# Patient Record
Sex: Female | Born: 1960 | Hispanic: No | Marital: Single | Smoking: Current every day smoker
Health system: Southern US, Community
[De-identification: ages and names within clinical notes are randomized; demographics above are authoritative.]

## PROBLEM LIST (undated history)

## (undated) DIAGNOSIS — I10 Essential (primary) hypertension: Secondary | ICD-10-CM

## (undated) DIAGNOSIS — E785 Hyperlipidemia, unspecified: Secondary | ICD-10-CM

## (undated) DIAGNOSIS — E119 Type 2 diabetes mellitus without complications: Secondary | ICD-10-CM

## (undated) DIAGNOSIS — I639 Cerebral infarction, unspecified: Secondary | ICD-10-CM

## (undated) HISTORY — DX: Hyperlipidemia, unspecified: E78.5

## (undated) HISTORY — DX: Type 2 diabetes mellitus without complications: E11.9

## (undated) HISTORY — DX: Cerebral infarction, unspecified: I63.9

---

## 2008-03-13 ENCOUNTER — Other Ambulatory Visit: Admission: RE | Admit: 2008-03-13 | Discharge: 2008-03-13 | Payer: Self-pay | Admitting: Obstetrics and Gynecology

## 2008-08-24 ENCOUNTER — Emergency Department (HOSPITAL_COMMUNITY): Admission: EM | Admit: 2008-08-24 | Discharge: 2008-08-24 | Payer: Self-pay | Admitting: Emergency Medicine

## 2010-07-13 ENCOUNTER — Encounter: Payer: Self-pay | Admitting: Obstetrics and Gynecology

## 2010-10-02 LAB — DIFFERENTIAL
Basophils Relative: 1 % (ref 0–1)
Lymphocytes Relative: 36 % (ref 12–46)
Lymphs Abs: 3 10*3/uL (ref 0.7–4.0)
Neutro Abs: 4.4 10*3/uL (ref 1.7–7.7)

## 2010-10-02 LAB — D-DIMER, QUANTITATIVE: D-Dimer, Quant: 0.28 ug/mL-FEU (ref 0.00–0.48)

## 2010-10-02 LAB — CBC
Hemoglobin: 10.7 g/dL — ABNORMAL LOW (ref 12.0–15.0)
Platelets: 200 10*3/uL (ref 150–400)
RBC: 4.29 MIL/uL (ref 3.87–5.11)

## 2010-10-02 LAB — POCT I-STAT, CHEM 8
BUN: 4 mg/dL — ABNORMAL LOW (ref 6–23)
Creatinine, Ser: 0.9 mg/dL (ref 0.4–1.2)
Glucose, Bld: 100 mg/dL — ABNORMAL HIGH (ref 70–99)
Hemoglobin: 11.9 g/dL — ABNORMAL LOW (ref 12.0–15.0)
Sodium: 140 mEq/L (ref 135–145)

## 2010-10-02 LAB — POCT CARDIAC MARKERS
CKMB, poc: <1 ng/mL — ABNORMAL LOW (ref 1.0–8.0)
Myoglobin, poc: 91.8 ng/mL (ref 12–200)
Troponin i, poc: <0.05 ng/mL (ref 0.00–0.09)

## 2011-04-28 ENCOUNTER — Encounter: Payer: Self-pay | Admitting: *Deleted

## 2011-04-28 ENCOUNTER — Emergency Department (INDEPENDENT_AMBULATORY_CARE_PROVIDER_SITE_OTHER)
Admission: EM | Admit: 2011-04-28 | Discharge: 2011-04-28 | Disposition: A | Discharge status: Home / Self Care | Payer: 59 | Source: Home / Self Care | Attending: Family Medicine | Admitting: Family Medicine

## 2011-04-28 DIAGNOSIS — J40 Bronchitis, not specified as acute or chronic: Secondary | ICD-10-CM

## 2011-04-28 MED ORDER — AZITHROMYCIN 250 MG PO TABS: 250.0000 mg | ORAL_TABLET | Freq: Every day | ORAL | Status: AC

## 2011-04-28 MED ORDER — GUAIFENESIN-CODEINE 100-10 MG/5ML PO SYRP: 5.0000 mL | ORAL_SOLUTION | Freq: Four times a day (QID) | ORAL | Status: AC | PRN

## 2011-04-28 NOTE — ED Provider Notes (Signed)
History     CSN: [id number] Arrival date & time: 04/28/2011  5:01 PM   First MD Initiated Contact with Patient 04/28/11 1741      Chief Complaint  Patient presents with  . Cough    4th day of a  nonproductive cough with fever  3 days ago.  Today c/o weakness and generalized myalgias    (Consider location/radiation/quality/duration/timing/severity/associated sxs/prior treatment) Patient is a 50 y.o. female presenting with cough.  Cough This is a new problem. The current episode started more than 2 days ago. The problem occurs constantly. The problem has not changed since onset.The cough is non-productive. The maximum temperature recorded prior to her arrival was 101 to 101.9 F. The fever has been present for 1 to 2 days. Associated symptoms include chills, sweats and rhinorrhea. Pertinent negatives include no chest pain and no wheezing. She has tried decongestants for the symptoms. The treatment provided mild relief. She is a smoker.    History reviewed. No pertinent past medical history.  History reviewed. No pertinent past surgical history.  Family History  Problem Relation Age of Onset  . Hypertension Mother   . Cancer Sister     History  Substance Use Topics  . Smoking status: Not on file  . Smokeless tobacco: Not on file  . Alcohol Use: No    OB History    Grav Para Term Preterm Abortions TAB SAB Ect Mult Living                  Review of Systems  Constitutional: Positive for fever and chills.  HENT: Positive for rhinorrhea and postnasal drip.   Respiratory: Positive for cough. Negative for wheezing.   Cardiovascular: Negative for chest pain.  Gastrointestinal: Negative.   Genitourinary: Negative.     Allergies  Review of patient's allergies indicates no known allergies.  Home Medications  No current outpatient prescriptions on file.  BP 163/93  Pulse 100  Temp(Src) 98.4 F (36.9 C) (Oral)  SpO2 99%  Physical Exam  Nursing note and vitals  reviewed. Constitutional: She appears well-developed and well-nourished.  HENT:  Head: Normocephalic and atraumatic.  Right Ear: External ear normal.  Left Ear: External ear normal.  Nose: Nose normal.  Mouth/Throat: Oropharynx is clear and moist.  Neck: Normal range of motion. Neck supple.  Cardiovascular: Normal rate and regular rhythm.   Pulmonary/Chest: Effort normal and breath sounds normal. No respiratory distress. She has no wheezes. She has no rales. She exhibits no tenderness.  Lymphadenopathy:    She has no cervical adenopathy.  Skin: Skin is warm and dry.  Psychiatric: She has a normal mood and affect.    ED Course  Procedures (including critical care time)  Labs Reviewed - No data to display No results found.   No diagnosis found.    MDM          Randa Spike, MD 04/28/11 [phone]

## 2011-07-08 ENCOUNTER — Emergency Department (HOSPITAL_COMMUNITY): Payer: 59

## 2011-07-08 ENCOUNTER — Observation Stay (HOSPITAL_COMMUNITY)
Admission: EM | Admit: 2011-07-08 | Discharge: 2011-07-09 | Disposition: A | Discharge status: Home / Self Care | Payer: 59 | Attending: Family Medicine | Admitting: Family Medicine

## 2011-07-08 ENCOUNTER — Other Ambulatory Visit: Payer: Self-pay

## 2011-07-08 ENCOUNTER — Encounter (HOSPITAL_COMMUNITY): Payer: Self-pay | Admitting: Emergency Medicine

## 2011-07-08 DIAGNOSIS — F17200 Nicotine dependence, unspecified, uncomplicated: Secondary | ICD-10-CM | POA: Insufficient documentation

## 2011-07-08 DIAGNOSIS — R404 Transient alteration of awareness: Secondary | ICD-10-CM

## 2011-07-08 DIAGNOSIS — E119 Type 2 diabetes mellitus without complications: Secondary | ICD-10-CM | POA: Insufficient documentation

## 2011-07-08 DIAGNOSIS — I10 Essential (primary) hypertension: Secondary | ICD-10-CM | POA: Diagnosis present

## 2011-07-08 DIAGNOSIS — E860 Dehydration: Secondary | ICD-10-CM | POA: Insufficient documentation

## 2011-07-08 DIAGNOSIS — R000 Tachycardia, unspecified: Secondary | ICD-10-CM | POA: Insufficient documentation

## 2011-07-08 DIAGNOSIS — E785 Hyperlipidemia, unspecified: Secondary | ICD-10-CM

## 2011-07-08 DIAGNOSIS — R739 Hyperglycemia, unspecified: Secondary | ICD-10-CM

## 2011-07-08 DIAGNOSIS — R55 Syncope and collapse: Principal | ICD-10-CM

## 2011-07-08 HISTORY — DX: Essential (primary) hypertension: I10

## 2011-07-08 LAB — DIFFERENTIAL
Basophils Absolute: 0 10*3/uL (ref 0.0–0.1)
Basophils Relative: 0 % (ref 0–1)
Eosinophils Absolute: 0 10*3/uL (ref 0.0–0.7)
Eosinophils Relative: 0 % (ref 0–5)
Lymphocytes Relative: 12 % (ref 12–46)
Lymphs Abs: 2.5 10*3/uL (ref 0.7–4.0)
Monocytes Absolute: 1.3 10*3/uL — ABNORMAL HIGH (ref 0.1–1.0)
Monocytes Relative: 6 % (ref 3–12)
Neutro Abs: 17.2 10*3/uL — ABNORMAL HIGH (ref 1.7–7.7)
Neutrophils Relative %: 82 % — ABNORMAL HIGH (ref 43–77)

## 2011-07-08 LAB — URINALYSIS, ROUTINE W REFLEX MICROSCOPIC
Bilirubin Urine: NEGATIVE
Glucose, UA: >1000 mg/dL — AB
Hgb urine dipstick: NEGATIVE
Ketones, ur: 15 mg/dL — AB
Nitrite: NEGATIVE
Protein, ur: NEGATIVE mg/dL
Specific Gravity, Urine: 1.017 (ref 1.005–1.030)
Urobilinogen, UA: 1 mg/dL (ref 0.0–1.0)
pH: 6.5 (ref 5.0–8.0)

## 2011-07-08 LAB — CBC
HCT: 35.8 % — ABNORMAL LOW (ref 36.0–46.0)
Hemoglobin: 11.1 g/dL — ABNORMAL LOW (ref 12.0–15.0)
MCH: 20.9 pg — ABNORMAL LOW (ref 26.0–34.0)
MCHC: 31 g/dL (ref 30.0–36.0)
MCV: 67.4 fL — ABNORMAL LOW (ref 78.0–100.0)
Platelets: 295 10*3/uL (ref 150–400)
RBC: 5.31 MIL/uL — ABNORMAL HIGH (ref 3.87–5.11)
RDW: 18.6 % — ABNORMAL HIGH (ref 11.5–15.5)
WBC: 21 10*3/uL — ABNORMAL HIGH (ref 4.0–10.5)

## 2011-07-08 LAB — COMPREHENSIVE METABOLIC PANEL
ALT: 18 U/L (ref 0–35)
AST: 19 U/L (ref 0–37)
Albumin: 3.5 g/dL (ref 3.5–5.2)
Alkaline Phosphatase: 116 U/L (ref 39–117)
BUN: 8 mg/dL (ref 6–23)
CO2: 24 mEq/L (ref 19–32)
Calcium: 9 mg/dL (ref 8.4–10.5)
Chloride: 100 mEq/L (ref 96–112)
Creatinine, Ser: 0.8 mg/dL (ref 0.50–1.10)
GFR calc Af Amer: >90 mL/min (ref >90)
GFR calc non Af Amer: 84 mL/min — ABNORMAL LOW (ref >90)
Glucose, Bld: 350 mg/dL — ABNORMAL HIGH (ref 70–99)
Potassium: 3.5 mEq/L (ref 3.5–5.1)
Sodium: 136 mEq/L (ref 135–145)
Total Bilirubin: 0.3 mg/dL (ref 0.3–1.2)
Total Protein: 8 g/dL (ref 6.0–8.3)

## 2011-07-08 LAB — GLUCOSE, CAPILLARY
Glucose-Capillary: 342 mg/dL — ABNORMAL HIGH (ref 70–99)
Glucose-Capillary: 316 mg/dL — ABNORMAL HIGH (ref 70–99)

## 2011-07-08 LAB — CARDIAC PANEL(CRET KIN+CKTOT+MB+TROPI)
CK, MB: 1.6 ng/mL (ref 0.3–4.0)
Relative Index: 0.9 (ref 0.0–2.5)
Total CK: 175 U/L (ref 7–177)
Troponin I: <0.3 ng/mL (ref <0.30)

## 2011-07-08 LAB — URINE MICROSCOPIC-ADD ON

## 2011-07-08 MED ORDER — ONDANSETRON HCL 4 MG/2ML IJ SOLN
INTRAMUSCULAR | Status: AC
  Administered 2011-07-08: 14:00:00
  Filled 2011-07-08: qty 2

## 2011-07-08 MED ORDER — ACETAMINOPHEN 325 MG PO TABS: 650.0000 mg | ORAL_TABLET | Freq: Four times a day (QID) | ORAL | Status: DC | PRN

## 2011-07-08 MED ORDER — SODIUM CHLORIDE 0.9 % IV SOLN
INTRAVENOUS | Status: DC
  Administered 2011-07-08: 20:00:00 via INTRAVENOUS

## 2011-07-08 MED ORDER — ACETAMINOPHEN 650 MG RE SUPP: 650.0000 mg | Freq: Four times a day (QID) | RECTAL | Status: DC | PRN

## 2011-07-08 MED ORDER — HEPARIN SODIUM (PORCINE) 5000 UNIT/ML IJ SOLN
5000.0000 [IU] | Freq: Three times a day (TID) | INTRAMUSCULAR | Status: DC
  Administered 2011-07-08 – 2011-07-09 (×2): 5000 [IU] via SUBCUTANEOUS
  Filled 2011-07-08 (×5): qty 1

## 2011-07-08 MED ORDER — ONDANSETRON HCL 4 MG PO TABS: 4.0000 mg | ORAL_TABLET | Freq: Four times a day (QID) | ORAL | Status: DC | PRN

## 2011-07-08 MED ORDER — SODIUM CHLORIDE 0.9 % IV BOLUS (SEPSIS)
1000.0000 mL | Freq: Once | INTRAVENOUS | Status: AC
  Administered 2011-07-08: 1000 mL via INTRAVENOUS

## 2011-07-08 MED ORDER — ONDANSETRON HCL 4 MG/2ML IJ SOLN: 4.0000 mg | Freq: Four times a day (QID) | INTRAMUSCULAR | Status: DC | PRN

## 2011-07-08 MED ORDER — INSULIN ASPART 100 UNIT/ML ~~LOC~~ SOLN
0.0000 [IU] | Freq: Three times a day (TID) | SUBCUTANEOUS | Status: DC
  Administered 2011-07-09 (×2): 3 [IU] via SUBCUTANEOUS
  Filled 2011-07-08: qty 3

## 2011-07-08 MED ORDER — DOCUSATE SODIUM 100 MG PO CAPS
100.0000 mg | ORAL_CAPSULE | Freq: Two times a day (BID) | ORAL | Status: DC
  Administered 2011-07-08 – 2011-07-09 (×2): 100 mg via ORAL
  Filled 2011-07-08 (×3): qty 1

## 2011-07-08 NOTE — ED Notes (Signed)
CBG 285. 

## 2011-07-08 NOTE — ED Notes (Signed)
Was at work  Started to tense up and ? Vomit  Was incontinent  Has no memory pulse 115 128/70 cbg 393 iv 20 left wrist also c/o abd pain given 4 zofran

## 2011-07-08 NOTE — H&P (Signed)
FMTS Attending Admit Note  Patient seen and examined by me, discussed with resident team and I agree with Dr. Jamie Kato assessment and plan.  Briefly, a 51yo R-handed F with scant interactions with medical community, presenting via EMS for syncopal episode at her workplace.  Co-workers came to her aid after she had fallen to ground; was told by coworker that she had a "stiff left arm and was able to move my right arm".  Some loss of urine, which she ascribes to recent increase in polyuria to the point of losing continence frequently.  No palpitations; did have premonition that she would pass out; felt foggy for some time afterward, was aware that EMS was transporting her but could not interact/speak with them. At present she feels back to baseline.  Reports similar epsiode in her home 6 months ago, unwitnessed.  Felt like she'd pass out, also nausea, went to BR to vomit and awoke in the floor.  Did not seek medical attention for that episode.  ROS: No head trauma, no sz history.  Did have a period where she had "shaking" in arms when she lived in [place] in mid-2000s (?2004 or 2006) and was seen by neurologist.  The symptoms eventually resolved.   Exam Pleasant, no apparent distress. HEENT Neck supple. EOMI.  Smile symmetric. Speech fluid and clear.  Shoulder shrug normal and symmetric.  COR S1S2, no extra sounds PULM Clear  Handgrip full and symmetric.  Assess/Plan: 51 yo F with LOC, also found to be markedly hyperglycemic with polyuria and polydipsia.  Prior episode like this 6 months ago.  Seems most consistent with uncontrolled DM2 and subsequent dehydration that resulted in vasovagal episode.  Seizure d/o is less likely.  To consider whether EEG is warranted as part of workup in order to remove the small doubt that exists as to this possibility. Will continue to watch on telemetry overnight, follow Cardiac Enzymes over night.   Paula Compton, M.D.

## 2011-07-08 NOTE — H&P (Signed)
Family Medicine Teaching [facility] Admission History and Physical  Patient name: Alexandra Wilson Medical record number: [id number] Date of birth: 07/20/1960 Age: 51 y.o. Gender: female  Primary Care Provider: Unassigned/none  Chief Complaint: syncope History of Present Illness: Alexandra Wilson is a 51 y.o. year old female that does not have established medical care but only known medical problem of HTN diagnosed at a health fair presenting with syncope.   Patient was at work today sitting down. She reports eating nothing today and only drinking 1 cup of coffee. Additionally, she says she has been peeing more frequently over the last few days. She ate an orange and she says she felt a rush of heat to her head. She then began to get very lightleaded and dizzy and sweaty. She laid her head down on her desk and says the next thing she knew EMS was at her work place. She does not recall any chest pain/shortness of breath/nausea/vomiting. Some diaphoresis.   She spoke with a coworker who says she was moaning and that her legs were slightly stiff and so was her left arm. She said that  so she sat down in a char. She says she does not remember events after that. Coworkers report that she may have had some shaking when this occurred. She says when she arrived in ED, her pants were somewhat moist and she was concerned that she may have urinated on herself but she is not sure. No bowel incontinence.   ROS-Asked patient about blurry vision and she says sometimes at the computer all day the screen will seem a little fuzzy but no issues outside of that. Patient also has some dysuria but reports she vigorously cleans her vagina due to a slight vaginal itch (no prominent discharge) and she believes the pain is coming from there. Patient says for last month or two she has had some increased thirst (wants water over just pepsi).   Patient reported to ED where she had a CT head without intracranial abnormality.  EKG with sinus tachycardia and no st-t wave abnormalities. CE neg x1. Patient was found to have a CBG of 350 and a WBC of 21k with a left shift.    Past Medical History  Diagnosis Date  . Hypertension   Has not seen a physician outside of urgent care in 8 years.   History reviewed. No pertinent past surgical history.  Family History  Problem Relation Age of Onset  . Hypertension Mother   . Cancer Sister   . Diabetes Cousin     also maternal aunts   Social History:  reports that she has been smoking Cigarettes.  She has a 20 pack-year smoking history. She does not have any smokeless tobacco history on file. She reports that she does not drink alcohol or use illicit drugs. Please see social history as updated with social situation.   Allergies: No Known Allergies  Medications Prior to Admission  Medication Dose Route Frequency Provider Last Rate Last Dose  . ondansetron (ZOFRAN) 4 MG/2ML injection           . sodium chloride 0.9 % bolus 1,000 mL  1,000 mL Intravenous Once Jamesetta Orleans Lawyer, PA-C   1,000 mL at 07/08/11 1335   No current outpatient prescriptions on file as of 07/08/2011.    Results for orders placed during the hospital encounter of 07/08/11 (from the past 48 hour(s))  GLUCOSE, CAPILLARY     Status: Abnormal   Collection Time   07/08/11 12:13  PM      Component Value Range Comment   Glucose-Capillary 342 (*) 70 - 99 (mg/dL)   COMPREHENSIVE METABOLIC PANEL     Status: Abnormal   Collection Time   07/08/11  1:06 PM      Component Value Range Comment   Sodium 136  135 - 145 (mEq/L)    Potassium 3.5  3.5 - 5.1 (mEq/L)    Chloride 100  96 - 112 (mEq/L)    CO2 24  19 - 32 (mEq/L)    Glucose, Bld 350 (*) 70 - 99 (mg/dL)    BUN 8  6 - 23 (mg/dL)    Creatinine, Ser 8.65  0.50 - 1.10 (mg/dL)    Calcium 9.0  8.4 - 10.5 (mg/dL)    Total Protein 8.0  6.0 - 8.3 (g/dL)    Albumin 3.5  3.5 - 5.2 (g/dL)    AST 19  0 - 37 (U/L)    ALT 18  0 - 35 (U/L)    Alkaline  Phosphatase 116  39 - 117 (U/L)    Total Bilirubin 0.3  0.3 - 1.2 (mg/dL)    GFR calc non Af Amer 84 (*) >90 (mL/min)    GFR calc Af Amer >90  >90 (mL/min)   CBC     Status: Abnormal   Collection Time   07/08/11  1:06 PM      Component Value Range Comment   WBC 21.0 (*) 4.0 - 10.5 (K/uL)    RBC 5.31 (*) 3.87 - 5.11 (MIL/uL)    Hemoglobin 11.1 (*) 12.0 - 15.0 (g/dL)    HCT 78.4 (*) 69.6 - 46.0 (%)    MCV 67.4 (*) 78.0 - 100.0 (fL)    MCH 20.9 (*) 26.0 - 34.0 (pg)    MCHC 31.0  30.0 - 36.0 (g/dL)    RDW 29.5 (*) 28.4 - 15.5 (%)    Platelets 295  150 - 400 (K/uL)   DIFFERENTIAL     Status: Abnormal   Collection Time   07/08/11  1:06 PM      Component Value Range Comment   Neutrophils Relative 82 (*) 43 - 77 (%)    Lymphocytes Relative 12  12 - 46 (%)    Monocytes Relative 6  3 - 12 (%)    Eosinophils Relative 0  0 - 5 (%)    Basophils Relative 0  0 - 1 (%)    Neutro Abs 17.2 (*) 1.7 - 7.7 (K/uL)    Lymphs Abs 2.5  0.7 - 4.0 (K/uL)    Monocytes Absolute 1.3 (*) 0.1 - 1.0 (K/uL)    Eosinophils Absolute 0.0  0.0 - 0.7 (K/uL)    Basophils Absolute 0.0  0.0 - 0.1 (K/uL)    Smear Review LARGE PLATELETS PRESENT     URINALYSIS, ROUTINE W REFLEX MICROSCOPIC     Status: Abnormal   Collection Time   07/08/11  1:22 PM      Component Value Range Comment   Color, Urine YELLOW  YELLOW     APPearance CLOUDY (*) CLEAR     Specific Gravity, Urine 1.017  1.005 - 1.030     pH 6.5  5.0 - 8.0     Glucose, UA >1000 (*) NEGATIVE (mg/dL)    Hgb urine dipstick NEGATIVE  NEGATIVE     Bilirubin Urine NEGATIVE  NEGATIVE     Ketones, ur 15 (*) NEGATIVE (mg/dL)    Protein, ur NEGATIVE  NEGATIVE (mg/dL)  Urobilinogen, UA 1.0  0.0 - 1.0 (mg/dL)    Nitrite NEGATIVE  NEGATIVE     Leukocytes, UA MODERATE (*) NEGATIVE    URINE MICROSCOPIC-ADD ON     Status: Abnormal   Collection Time   07/08/11  1:22 PM      Component Value Range Comment   Squamous Epithelial / LPF FEW (*) RARE     WBC, UA 3-6  <3  (WBC/hpf)    Bacteria, UA FEW (*) RARE    GLUCOSE, CAPILLARY     Status: Abnormal   Collection Time   07/08/11  3:02 PM      Component Value Range Comment   Glucose-Capillary 316 (*) 70 - 99 (mg/dL)    Comment 1 Documented in Chart      Comment 2 Notify RN     CARDIAC PANEL(CRET KIN+CKTOT+MB+TROPI)     Status: Normal   Collection Time   07/08/11  3:28 PM      Component Value Range Comment   Total CK 175  7 - 177 (U/L)    CK, MB 1.6  0.3 - 4.0 (ng/mL)    Troponin I <0.30  <0.30 (ng/mL)    Relative Index 0.9  0.0 - 2.5     Dg Chest 2 View  07/08/2011  *RADIOLOGY REPORT*  Clinical Data: Syncope, smoker  CHEST - 2 VIEW  Comparison: 08/24/2008  Findings: Upper-normal size of cardiac silhouette. Mediastinal contours and pulmonary vascularity normal. Peribronchial thickening. No pulmonary infiltrate, pleural effusion, or pneumothorax. No acute osseous findings.  IMPRESSION: Minimal bronchitic changes.  Original Report Authenticated By: Lollie Marrow, M.D.   Ct Head Wo Contrast  07/08/2011  *RADIOLOGY REPORT*  Clinical Data: Seizure.  CT HEAD WITHOUT CONTRAST  Technique:  Contiguous axial images were obtained from the base of the skull through the vertex without contrast.  Comparison: None.  Findings: The brain has a normal appearance without evidence of atrophy, old or acute infarction, mass lesion, hemorrhage, hydrocephalus or extra-axial collection.  Calvarium is unremarkable.  Sinuses, middle ears and mastoids are clear.  IMPRESSION: Normal head CT  Original Report Authenticated By: Thomasenia Sales, M.D.    ROS negative except as noted in HPI   Blood pressure 105/67, pulse 103, temperature 97 F (36.1 C), temperature source Oral, resp. rate 18, SpO2 100.00%. Physical Exam  Constitutional: She is oriented to person, place, and time. She appears well-developed and well-nourished. No distress.  HENT:  Mouth/Throat: No oropharyngeal exudate.       Dry mucus membranes.   Eyes: Pupils are equal,  round, and reactive to light.  Neck: Normal range of motion. Neck supple.  Cardiovascular: Normal rate and regular rhythm.  Exam reveals no gallop and no friction rub.   No murmur heard. Respiratory: Effort normal and breath sounds normal. No respiratory distress. She has no wheezes. She has no rales.  GI: Soft. Bowel sounds are normal. She exhibits no distension. There is no tenderness. There is no rebound.  Musculoskeletal: Normal range of motion. Edema: trace bilateral ankle edema.  Neurological: She is alert and oriented to person, place, and time. She displays normal reflexes. She exhibits normal muscle tone. Coordination normal.       5/5 muscle strength bilateral upper extremities. No focal deficits. Sensation intact.      Assessment/Plan  51 y.o. year old female that does not have established medical care but only known medical problem of HTN presenting with syncope.   1. Syncope-CT head negative for intracranial abnormality.  EKG with sinus tachycardia and no st-t wave abnormalities. DDx: dehydration causing vasovagal most likely cause as patient with poor PO throughout the morning and exam with appearance of dehydration. Orthostatics showed increased HR by 15 as well. Do not suspect seizure but in differential.CTA negative and primary seizures in 51 year old females are uncommon.  Unsure if patient was incontinent of urine or just overall sweaty. Will rule out MI. Monitor for arrythmias. UDS to rule out drug use.  *will monitor on telemetry for 23 hour observation  *will hydrate with IVF *cycle cardiac enzymes x2  *Risk stratification : lipids, a1c, tsh *see #3 for possible ID concerns.  *consider EEG outpatient *UDS  2. Possible new onset diabetes-will obtain A1c. Will not order ABG at this time given BS trending down, but if were to increase would consider ABG for evaluation of DKA. SSI as patient insulin naive. Suspect DM. Ordered inpatient diabetes counselor to see patient  3.  Tobacco abuse-encouraged smoking cessation and ordered smoking cessation counseling.   3. Leukocytosis- UA with moderate leukocytes. Will get Urine Cx. Given syncope and tachycardia and WBC count. Would have low threshold to get blood cultures. CXR with minimal bronchitic changes.   4. Hx HTN-currently with blood pressures on low range of normal. WIll monitor blood pressures.   FEN/GI-carb modified diet. NS 100 ml/hr PPx-heparin SQ Disposition-pending further work up/evaluation   [place], Idell Hissong 07/08/2011, 4:17 PM    PGY-2 ADDENDUM I have seen and examined this patient and agree with Dr. Erasmo Leventhal above note and assessment and plan.  For full details please see excellent PGY-1 note.  Briefly, this is a 51 yo AAF who hasn't seen an MD since 2008 presenting after syncopal episode while at work this morning.  She states she skipped breakfast and while at her desk at work she hot a "hot" feeling, put her head down on the table, and then doesn't remember anything until EMS arrived.  Patient had her co-worker on the phone who described what happened as her lying on the ground moaning, bilateral lower extremities out and stiff with maybe some left-sided contracture of her left arm.  She had an episode of urinary incontinence/ pants getting "a little wet".  They are unsure of what interventions were done by EMS.  Patient does not have any known history of seizure or seizure disorder.  She has possible HTN from an isolated elevated BP during a health screen.  No history of diabetes, but does have family history on her maternal side.  Has had some increased thirst the past 2-3 weeks and increased urination the past 2-3 days.  She has had some dysuria but attributes this to vigorous vaginal cleansing due to vaginal itching, and dysuria does not occur if she does not scratch/clean the area.  No fevers or chills, no N/V/D, no chest pain, palpitations or shortness of breath.  Possibly had a similar episode of  pre-syncope 6 months ago that resolved spontaneously with drinking 2-3 bottles of water.  She is a 1ppd smoker, but denies tobacco or illicit drugs.   PE: Filed Vitals:   07/08/11 1630  BP: 112/77  Pulse: 108  Temp:   Resp:     Gen: NAD, lying in stretcher, pleasant, overweight HEENT: dry mucus membranes, PERRLA, EOMI, no cervical LAD, fair dentition, no pharyngeal erythema CV: RRR, no murmur Pulm: CTA bilaterally, no wheezes or crackles Abd: obese, soft, nontender, nondistended, +BS Ext: warm, 2+ pulses, no edema Neuro: A&O x3, moves all 4  extremities equally, no focal deficits  Labs:  BMET unremarkable except glucose 342, UA with >1000 glucose, 15 ketonesmod LE, few bacteria and epi CBC with leukocytosis to 22 with left shift, hgb 11 CT head: negative CXR: Minimal bronchitic changes EKG: sinus tachycardia  A/P: 51 yo AAF p/w syncopal episode likely secondary to dehydration from undiagnosed type 2 DM  1.Syncope: Ddx includes hypoglycemia vs hypotension vs vasovagal vs seizure vs cardiac arrhythmia - Seziure less likely given unilateral upper ext but bilateral lower ext involvement with no known seizure d/o and normal head CT - Pt is currently hyperglycemic with >1000 glucose in urine, so even if patient was hypoglycemic at the scene and given D50 (which is unlikely), still would not expect these findings; she is likely an undiagnosed type II diabetic - Most likely cause is hypotension from dehydration; currently appears dry with mild tachycardia and increased HR with orthostatics - Continue IVF - Check UDS for other possible causes of syncope - Monitor on telemetry overnight and cycle cardiac enzymes in this overweight AAF who has possible h/o HTN and no medical care in 5 years; already has one negative set of CEs - Place on SSI, check A1c, FLP - Check fasting CBG in the morning for definitive diagnosis of DM, then will get diabetic teaching  *could consider outpatient   2.  ?HTN -currently normo/hypotensive -will monitor  3. Tobacco abuse - 1ppd history with bronchitis changes on CXR - Nicoderm patch if patient would like - Smoking cessation counseling   4. +LE, few bacteria on UA - May be UTI, but will culture urine prior to treatment   - FEN/GI: NS @ 100cc/hr, carb consistent diet po ad lib - Ppx: SQ heparin - Dispo: pending clinical improvement, completion of syncope work up, likely need for diabetic teaching  [facility] 07/08/2011 6:08 PM

## 2011-07-08 NOTE — ED Provider Notes (Signed)
Patient had an episode where she felt hot and lightheaded and nauseous and sweaty. She sat down and drank lobe of water but didn't feel better. She then passed out and coworkers witnessed what appears to been a seizure. She is now completely recovered. Workup was significant for a blood sugar of 350 which he was not aware of. On further questioning, she has had urinary frequency and she is noted that her vision has been blurry recently. Her WBC is 21,000 which is consistent with a stress reaction. Her exam shows absolutely no signs of nuchal rigidity and she is afebrile. Case has been discussed with family practice resident and she will be admitted for observation as well as initiation of treatment for diabetes. I suspect that she did have a seizure based on her remaining in the sitting position as she had her vagal episode.  Dione Booze, MD 07/08/11 [phone]

## 2011-07-08 NOTE — ED Provider Notes (Signed)
History     CSN: [id number]  Arrival date & time 07/08/11  1134   First MD Initiated Contact with Patient 07/08/11 1221      Chief Complaint  Patient presents with  . Seizures    (Consider location/radiation/quality/duration/timing/severity/associated sxs/prior treatment) The history is provided by the patient and a parent.   51 year old AA female presents to ED for possible seizure at work. Per EMS, her coworkers report that she began to stare, foam at the mouth, and began shaking. When EMS arrived, she was showing signs of postictal confusion and they reported that she vomited and was incontinent. The patient does not remember what happened. She reports that she remembers feeling dizzy and "funny all over" and the next thing she remembers is lying on the floor and hearing the EMS talk to her. Her mother states that they told her to bring a change of clothes because she had peed, but when her mom found her she was dry. Upon arrival, the staff reports that she was still somewhat confused, able to answer questions but slowly. She denies any pain. She reports some mild abdominal discomfort but thinks its because she needs to go to the bathroom. She reports numbness on the tip of her tongue but denies weakness, paresthesias, vision changes, and headache. No prior history or family history of epilepsy. EMS reported a CBG of 393. She denies diabetes or hyperglycemia in the past.   Past Medical History  Diagnosis Date  . Hypertension     History reviewed. No pertinent past surgical history.  Family History  Problem Relation Age of Onset  . Hypertension Mother   . Cancer Sister     History  Substance Use Topics  . Smoking status: Not on file  . Smokeless tobacco: Not on file  . Alcohol Use: No    OB History    Grav Para Term Preterm Abortions TAB SAB Ect Mult Living                  Review of Systems All pertinent positives and negatives reviewed in the history of present  illness  Allergies  Review of patient's allergies indicates no known allergies.  Home Medications  No current outpatient prescriptions on file.  BP 106/68  Pulse 89  Temp(Src) 98.2 F (36.8 C) (Oral)  Resp 18  SpO2 100%  Physical Exam  Constitutional: She is oriented to person, place, and time. She appears well-developed and well-nourished.       Overweight  HENT:  Head: Normocephalic and atraumatic.  Neck: No rigidity.  Cardiovascular: Normal rate, regular rhythm and normal heart sounds.        Distant heart sounds due to obesity  Pulmonary/Chest: Effort normal and breath sounds normal.  Abdominal: Soft. Bowel sounds are normal. There is tenderness in the left lower quadrant.       Mild tenderness in LLQ on palpation   Neurological: She is alert and oriented to person, place, and time.  Skin: Skin is warm and dry. She is not diaphoretic.  Psychiatric: She has a normal mood and affect. Her speech is normal and behavior is normal. Thought content normal.    ED Course  Procedures (including critical care time)   Labs Reviewed  POCT CBG MONITORING  URINALYSIS, ROUTINE W REFLEX MICROSCOPIC  COMPREHENSIVE METABOLIC PANEL  CBC  DIFFERENTIAL          MDM     Date: 07/08/2011  Rate: 103  Rhythm: sinus tachycardia  QRS Axis: normal  Intervals: normal  ST/T Wave abnormalities: nonspecific T wave changes  Conduction Disutrbances:none  Narrative Interpretation:   Old EKG Reviewed: unchanged    MDM Reviewed: nursing note and vitals Interpretation: labs, ECG, x-ray and CT scan Consults: admitting MD      Carlyle Dolly, PA-C 07/08/11 1606

## 2011-07-08 NOTE — ED Notes (Signed)
Pt feeling better at this time.

## 2011-07-08 NOTE — ED Provider Notes (Signed)
Medical screening examination/treatment/procedure(s) were conducted as a shared visit with non-physician practitioner(s) and myself.  I personally evaluated the patient during the encounter   Dione Booze, MD 07/08/11 1651

## 2011-07-08 NOTE — ED Notes (Signed)
sz pads placed on bed

## 2011-07-08 NOTE — ED Notes (Signed)
[id number] Ready 

## 2011-07-09 ENCOUNTER — Inpatient Hospital Stay (HOSPITAL_COMMUNITY)
Admit: 2011-07-09 | Discharge: 2011-07-09 | Disposition: A | Discharge status: Home / Self Care | Payer: 59 | Attending: Family Medicine | Admitting: Family Medicine

## 2011-07-09 DIAGNOSIS — E785 Hyperlipidemia, unspecified: Secondary | ICD-10-CM

## 2011-07-09 LAB — BASIC METABOLIC PANEL
BUN: 10 mg/dL (ref 6–23)
CO2: 24 mEq/L (ref 19–32)
Calcium: 8.8 mg/dL (ref 8.4–10.5)
Creatinine, Ser: 0.79 mg/dL (ref 0.50–1.10)
Glucose, Bld: 223 mg/dL — ABNORMAL HIGH (ref 70–99)

## 2011-07-09 LAB — URINE CULTURE: Culture  Setup Time: 201301170518

## 2011-07-09 LAB — GLUCOSE, CAPILLARY
Glucose-Capillary: 220 mg/dL — ABNORMAL HIGH (ref 70–99)
Glucose-Capillary: 241 mg/dL — ABNORMAL HIGH (ref 70–99)

## 2011-07-09 LAB — RAPID URINE DRUG SCREEN, HOSP PERFORMED
Cocaine: NOT DETECTED
Opiates: NOT DETECTED

## 2011-07-09 LAB — CBC
HCT: 31.4 % — ABNORMAL LOW (ref 36.0–46.0)
Hemoglobin: 9.6 g/dL — ABNORMAL LOW (ref 12.0–15.0)
MCH: 20.5 pg — ABNORMAL LOW (ref 26.0–34.0)
MCHC: 30.6 g/dL (ref 30.0–36.0)
MCV: 67.1 fL — ABNORMAL LOW (ref 78.0–100.0)
RBC: 4.68 MIL/uL (ref 3.87–5.11)

## 2011-07-09 LAB — LIPID PANEL
HDL: 39 mg/dL — ABNORMAL LOW (ref >39)
LDL Cholesterol: 109 mg/dL — ABNORMAL HIGH (ref 0–99)
Total CHOL/HDL Ratio: 4.2 RATIO
VLDL: 16 mg/dL (ref 0–40)

## 2011-07-09 LAB — CARDIAC PANEL(CRET KIN+CKTOT+MB+TROPI)
CK, MB: 2.1 ng/mL (ref 0.3–4.0)
Total CK: 208 U/L — ABNORMAL HIGH (ref 7–177)
Troponin I: <0.3 ng/mL (ref <0.30)

## 2011-07-09 MED ORDER — ONETOUCH ULTRA SYSTEM W/DEVICE KIT: 1.0000 | PACK | Freq: Once | Status: DC

## 2011-07-09 MED ORDER — SIMVASTATIN 20 MG PO TABS: 20.0000 mg | ORAL_TABLET | Freq: Every day | ORAL | Status: DC

## 2011-07-09 MED ORDER — METFORMIN HCL 500 MG PO TABS: 500.0000 mg | ORAL_TABLET | Freq: Two times a day (BID) | ORAL | Status: DC

## 2011-07-09 MED ORDER — METFORMIN HCL 500 MG PO TABS
500.0000 mg | ORAL_TABLET | Freq: Two times a day (BID) | ORAL | Status: DC
  Filled 2011-07-09 (×2): qty 1

## 2011-07-09 MED ORDER — GLIPIZIDE 10 MG PO TABS
10.0000 mg | ORAL_TABLET | Freq: Every day | ORAL | Status: DC
  Administered 2011-07-09: 10 mg via ORAL
  Filled 2011-07-09 (×2): qty 1

## 2011-07-09 MED ORDER — LIVING WELL WITH DIABETES BOOK
Freq: Once | Status: AC
  Administered 2011-07-09: 13:00:00
  Filled 2011-07-09: qty 1

## 2011-07-09 MED ORDER — GLIPIZIDE 10 MG PO TABS: 10.0000 mg | ORAL_TABLET | Freq: Every day | ORAL | Status: DC

## 2011-07-09 MED ORDER — ASPIRIN EC 81 MG PO TBEC: 81.0000 mg | DELAYED_RELEASE_TABLET | Freq: Every day | ORAL | Status: DC

## 2011-07-09 MED ORDER — SIMVASTATIN 20 MG PO TABS
20.0000 mg | ORAL_TABLET | Freq: Every day | ORAL | Status: DC
  Filled 2011-07-09: qty 1

## 2011-07-09 NOTE — Consult Note (Signed)
[facility] CONSULT [facility]     Reason for Consult: post syncope myoclonus CC: post syncope myoclonus   HPI:    Alexandra Wilson is an 51 y.o. female who was at work yesterday when she suddenly felt "flushed, sweaty, and noted her vision became tunneled".  She also noted she felt thirsty.  As she reached for some water she passed out.  Bystanders quickly came to her side and laid her on the floor.  There is mention of 2-3 quick body jerks right after she passed out but no sustained jerking.  She had no tongue biting, incontinence or post ictal period She states she quickly regained conscious.  She is now back to her baseline. Upon hospitalization CT head negative but UA with moderate leukocytes. In addition glucose was elevated at 350.   Past Medical History  Diagnosis Date  . Hypertension     History reviewed. No pertinent past surgical history.  Family History  Problem Relation Age of Onset  . Hypertension Mother   . Cancer Sister   . Diabetes Cousin     also maternal aunts    Social History:  reports that she has been smoking Cigarettes.  She has a 20 pack-year smoking history. She does not have any smokeless tobacco history on file. She reports that she does not drink alcohol or use illicit drugs.  No Known Allergies  Medications:    Prior to Admission:  No prescriptions prior to admission   Scheduled:   . docusate sodium  100 mg Oral BID  . heparin  5,000 Units Subcutaneous Q8H  . insulin aspart  0-9 Units Subcutaneous TID WC  . metFORMIN  500 mg Oral BID WC  . ondansetron      . simvastatin  20 mg Oral q1800  . sodium chloride  1,000 mL Intravenous Once    Review of Systems - General ROS: negative for - chills, fatigue, fever or hot flashes Hematological and Lymphatic ROS: negative for - bruising, fatigue, jaundice or pallor Endocrine ROS: negative for - hair pattern changes, hot flashes, mood swings or skin changes Respiratory  ROS: negative for - cough, hemoptysis, orthopnea or wheezing Cardiovascular ROS: negative for - dyspnea on exertion, orthopnea, palpitations or shortness of breath Gastrointestinal ROS: negative for - abdominal pain, appetite loss, blood in stools, diarrhea or hematemesis Musculoskeletal ROS: negative for - joint pain, joint stiffness, joint swelling or muscle pain Neurological ROS: See HPI Dermatological ROS: negative for dry skin, pruritus and rash   Blood pressure 111/73, pulse 80, temperature 98.4 F (36.9 C), temperature source Oral, resp. rate 18, height 5\' 6"  (1.676 m), weight 94 kg (207 lb 3.7 oz), SpO2 96.00%.   Neurologic Examination:  Mental Status: Alert, oriented, thought content appropriate.  Speech fluent without evidence of aphasia. Able to follow 3 step commands without difficulty. Cranial Nerves: II-Visual fields grossly intact. III/IV/VI-Extraocular movements intact.  Pupils reactive bilaterally. V/VII-Smile symmetric VIII-grossly intact IX/X-normal gag XI-bilateral shoulder shrug XII-midline tongue extension Motor: 5/5 bilaterally with normal tone and bulk Sensory: Pinprick and light touch intact throughout, bilaterally Deep Tendon Reflexes: 2+ and symmetric throughout Plantars: Downgoing bilaterally Cerebellar: Normal finger-to-nose, normal rapid alternating movements and normal heel-to-shin test.  Normal gait and station.    Lab Results  Component Value Date/Time   CHOL 164 07/09/2011  6:30 AM    Results for orders placed during the  hospital encounter of 07/08/11 (from the past 48 hour(s))  GLUCOSE, CAPILLARY     Status: Abnormal   Collection Time   07/08/11 12:13 PM      Component Value Range Comment   Glucose-Capillary 342 (*) 70 - 99 (mg/dL)   COMPREHENSIVE METABOLIC PANEL     Status: Abnormal   Collection Time   07/08/11  1:06 PM      Component Value Range Comment   Sodium 136  135 - 145 (mEq/L)    Potassium 3.5  3.5 - 5.1 (mEq/L)    Chloride 100   96 - 112 (mEq/L)    CO2 24  19 - 32 (mEq/L)    Glucose, Bld 350 (*) 70 - 99 (mg/dL)    BUN 8  6 - 23 (mg/dL)    Creatinine, Ser 4.54  0.50 - 1.10 (mg/dL)    Calcium 9.0  8.4 - 10.5 (mg/dL)    Total Protein 8.0  6.0 - 8.3 (g/dL)    Albumin 3.5  3.5 - 5.2 (g/dL)    AST 19  0 - 37 (U/L)    ALT 18  0 - 35 (U/L)    Alkaline Phosphatase 116  39 - 117 (U/L)    Total Bilirubin 0.3  0.3 - 1.2 (mg/dL)    GFR calc non Af Amer 84 (*) >90 (mL/min)    GFR calc Af Amer >90  >90 (mL/min)   CBC     Status: Abnormal   Collection Time   07/08/11  1:06 PM      Component Value Range Comment   WBC 21.0 (*) 4.0 - 10.5 (K/uL)    RBC 5.31 (*) 3.87 - 5.11 (MIL/uL)    Hemoglobin 11.1 (*) 12.0 - 15.0 (g/dL)    HCT 09.8 (*) 11.9 - 46.0 (%)    MCV 67.4 (*) 78.0 - 100.0 (fL)    MCH 20.9 (*) 26.0 - 34.0 (pg)    MCHC 31.0  30.0 - 36.0 (g/dL)    RDW 14.7 (*) 82.9 - 15.5 (%)    Platelets 295  150 - 400 (K/uL)   DIFFERENTIAL     Status: Abnormal   Collection Time   07/08/11  1:06 PM      Component Value Range Comment   Neutrophils Relative 82 (*) 43 - 77 (%)    Lymphocytes Relative 12  12 - 46 (%)    Monocytes Relative 6  3 - 12 (%)    Eosinophils Relative 0  0 - 5 (%)    Basophils Relative 0  0 - 1 (%)    Neutro Abs 17.2 (*) 1.7 - 7.7 (K/uL)    Lymphs Abs 2.5  0.7 - 4.0 (K/uL)    Monocytes Absolute 1.3 (*) 0.1 - 1.0 (K/uL)    Eosinophils Absolute 0.0  0.0 - 0.7 (K/uL)    Basophils Absolute 0.0  0.0 - 0.1 (K/uL)    Smear Review LARGE PLATELETS PRESENT     URINALYSIS, ROUTINE W REFLEX MICROSCOPIC     Status: Abnormal   Collection Time   07/08/11  1:22 PM      Component Value Range Comment   Color, Urine YELLOW  YELLOW     APPearance CLOUDY (*) CLEAR     Specific Gravity, Urine 1.017  1.005 - 1.030     pH 6.5  5.0 - 8.0     Glucose, UA >1000 (*) NEGATIVE (mg/dL)    Hgb urine dipstick NEGATIVE  NEGATIVE     Bilirubin  Urine NEGATIVE  NEGATIVE     Ketones, ur 15 (*) NEGATIVE (mg/dL)    Protein, ur NEGATIVE   NEGATIVE (mg/dL)    Urobilinogen, UA 1.0  0.0 - 1.0 (mg/dL)    Nitrite NEGATIVE  NEGATIVE     Leukocytes, UA MODERATE (*) NEGATIVE    URINE MICROSCOPIC-ADD ON     Status: Abnormal   Collection Time   07/08/11  1:22 PM      Component Value Range Comment   Squamous Epithelial / LPF FEW (*) RARE     WBC, UA 3-6  <3 (WBC/hpf)    Bacteria, UA FEW (*) RARE    GLUCOSE, CAPILLARY     Status: Abnormal   Collection Time   07/08/11  3:02 PM      Component Value Range Comment   Glucose-Capillary 316 (*) 70 - 99 (mg/dL)    Comment 1 Documented in Chart      Comment 2 Notify RN     CARDIAC PANEL(CRET KIN+CKTOT+MB+TROPI)     Status: Normal   Collection Time   07/08/11  3:28 PM      Component Value Range Comment   Total CK 175  7 - 177 (U/L)    CK, MB 1.6  0.3 - 4.0 (ng/mL)    Troponin I <0.30  <0.30 (ng/mL)    Relative Index 0.9  0.0 - 2.5    GLUCOSE, CAPILLARY     Status: Abnormal   Collection Time   07/08/11  5:08 PM      Component Value Range Comment   Glucose-Capillary 285 (*) 70 - 99 (mg/dL)    Comment 1 Notify RN     GLUCOSE, CAPILLARY     Status: Abnormal   Collection Time   07/08/11  7:57 PM      Component Value Range Comment   Glucose-Capillary 214 (*) 70 - 99 (mg/dL)    Comment 1 Notify RN     TSH     Status: Normal   Collection Time   07/08/11  9:38 PM      Component Value Range Comment   TSH 1.083  0.350 - 4.500 (uIU/mL)   HEMOGLOBIN A1C     Status: Abnormal   Collection Time   07/08/11  9:38 PM      Component Value Range Comment   Hemoglobin A1C 10.1 (*) <5.7 (%)    Mean Plasma Glucose 243 (*) <117 (mg/dL)   CARDIAC PANEL(CRET KIN+CKTOT+MB+TROPI)     Status: Abnormal   Collection Time   07/08/11 11:33 PM      Component Value Range Comment   Total CK 208 (*) 7 - 177 (U/L)    CK, MB 2.1  0.3 - 4.0 (ng/mL)    Troponin I <0.30  <0.30 (ng/mL)    Relative Index 1.0  0.0 - 2.5    URINE RAPID DRUG SCREEN (HOSP PERFORMED)     Status: Normal   Collection Time   07/09/11  4:49  AM      Component Value Range Comment   Opiates NONE DETECTED  NONE DETECTED     Cocaine NONE DETECTED  NONE DETECTED     Benzodiazepines NONE DETECTED  NONE DETECTED     Amphetamines NONE DETECTED  NONE DETECTED     Tetrahydrocannabinol NONE DETECTED  NONE DETECTED     Barbiturates NONE DETECTED  NONE DETECTED    CBC     Status: Abnormal   Collection Time   07/09/11  6:30 AM  Component Value Range Comment   WBC 15.2 (*) 4.0 - 10.5 (K/uL)    RBC 4.68  3.87 - 5.11 (MIL/uL)    Hemoglobin 9.6 (*) 12.0 - 15.0 (g/dL)    HCT 40.9 (*) 81.1 - 46.0 (%)    MCV 67.1 (*) 78.0 - 100.0 (fL)    MCH 20.5 (*) 26.0 - 34.0 (pg)    MCHC 30.6  30.0 - 36.0 (g/dL)    RDW 91.4 (*) 78.2 - 15.5 (%)    Platelets 258  150 - 400 (K/uL)   BASIC METABOLIC PANEL     Status: Abnormal   Collection Time   07/09/11  6:30 AM      Component Value Range Comment   Sodium 139  135 - 145 (mEq/L)    Potassium 3.9  3.5 - 5.1 (mEq/L)    Chloride 106  96 - 112 (mEq/L)    CO2 24  19 - 32 (mEq/L)    Glucose, Bld 223 (*) 70 - 99 (mg/dL)    BUN 10  6 - 23 (mg/dL)    Creatinine, Ser 9.56  0.50 - 1.10 (mg/dL)    Calcium 8.8  8.4 - 10.5 (mg/dL)    GFR calc non Af Amer >90  >90 (mL/min)    GFR calc Af Amer >90  >90 (mL/min)   LIPID PANEL     Status: Abnormal   Collection Time   07/09/11  6:30 AM      Component Value Range Comment   Cholesterol 164  0 - 200 (mg/dL)    Triglycerides 78  <213 (mg/dL)    HDL 39 (*) >08 (mg/dL)    Total CHOL/HDL Ratio 4.2      VLDL 16  0 - 40 (mg/dL)    LDL Cholesterol 657 (*) 0 - 99 (mg/dL)   GLUCOSE, CAPILLARY     Status: Abnormal   Collection Time   07/09/11  7:35 AM      Component Value Range Comment   Glucose-Capillary 220 (*) 70 - 99 (mg/dL)    Comment 1 Notify RN       Dg Chest 2 View  07/08/2011  *RADIOLOGY REPORT*  Clinical Data: Syncope, smoker  CHEST - 2 VIEW  Comparison: 08/24/2008  Findings: Upper-normal size of cardiac silhouette. Mediastinal contours and pulmonary  vascularity normal. Peribronchial thickening. No pulmonary infiltrate, pleural effusion, or pneumothorax. No acute osseous findings.  IMPRESSION: Minimal bronchitic changes.  Original Report Authenticated By: Lollie Marrow, M.D.   Ct Head Wo Contrast  07/08/2011  *RADIOLOGY REPORT*  Clinical Data: Seizure.  CT HEAD WITHOUT CONTRAST  Technique:  Contiguous axial images were obtained from the base of the skull through the vertex without contrast.  Comparison: None.  Findings: The brain has a normal appearance without evidence of atrophy, old or acute infarction, mass lesion, hemorrhage, hydrocephalus or extra-axial collection.  Calvarium is unremarkable.  Sinuses, middle ears and mastoids are clear.  IMPRESSION: Normal head CT  Original Report Authenticated By: Thomasenia Sales, M.D.     Assessment/Plan:    51 YO female with what most likely represents syncopal episode with myoclonic activity in the setting of UTI and hyperglycemia.  Patient is now back to her baseline.  CT head negative for acute intracranial abnormality or bleed.   Recommend: 1) EEG 2) daily ASA 82 mg 3) hydrate 4) correct Blood glucose 5) treat UTI if cultures positive 6) driving restrictions per medicine  No further neurological testing recommended.   EEG shows generalized  slowing and no epileptiform activity.  Neurology will S/O  I have discussed patient with Dr. Lyman Speller and he has seen the patient and agrees with the above mentioned.   Felicie Morn PA-C [facility] [phone]  07/09/2011, 10:11 AM

## 2011-07-09 NOTE — Discharge Summary (Signed)
Physician Discharge Summary  Patient ID: Alexandra Wilson MRN: [id number] DOB: Apr 17, 1961 Age: 51 y.o.  Admit date: 07/08/2011 Discharge date: 07/09/2011  PCP: No primary provider on file.  Consultants:[facility]     Discharge Diagnosis:  Principal Problem:  *Syncope Active Problems:  HTN (hypertension)  Hyperglycemia  Hyperlipidemia    Hospital Course 51 y.o. year old female that does not have established medical care but only known medical problem of HTN presenting with syncope found to have new onset diabetes with a1c of 10.1. Patient likely with syncopal episode due to vasovagal syncope from dehydration (poor PO and increased urination from DM) and possible myoclonic jerk.   1. Syncope-CT head negative for intracranial abnormality. EKG with sinus tachycardia and no st-t wave abnormalities. DDx: dehydration causing vasovagal most likely cause as patient with poor PO throughout the morning and exam with appearance of dehydration. Orthostatics showed increased HR by 15 as well. Do not suspect seizure but in differential.CTA negative and primary seizures in 51 year old females are uncommon. Unsure if patient was incontinent of urine or just overall sweaty.  *monitored on telemetry without any arrhythmias noted  *hydrated with IVF  *UDS negative  *cardiac enzymes x2 negative  *Risk stratification : lipids with low HDL and LDL 109, a1c 10.1, tsh wnl  *see #3 for possible ID concerns.  *EEG ordered-will not await read before discharge  *neurology consult placed with EEG recommended. EEG showed generalized slowing and no epileptiform activity. Neurology signed off. Thought possible myoclonic jerk after vasovagal episode  *Patient instructed not to drive until EEG results available. Discharged before results available. Will call patient to alert.   2. New onset diabetes-A1c 10.1. SSI. Ordered inpatient diabetes counselor to see patient. Patient ok with metformin and  willing to do outpatient counseling and lifestyle changes. Also started glipizide.  3. HLD-will start low dose statin at discharge  4. Tobacco abuse-encouraged smoking cessation and ordered smoking cessation counseling.  5. Leukocytosis- afebrile and WBC trending down. UA with moderate leukocytes. Pending Urine Cx. Given syncope and tachycardia and WBC count. Would have low threshold to get blood cultures. CXR with minimal bronchitic changes.  6. Hx HTN-currently with blood pressures not elevated.       Procedures/Imaging:  Dg Chest 2 View  07/08/2011  *RADIOLOGY REPORT*  Clinical Data: Syncope, smoker  CHEST - 2 VIEW  Comparison: 08/24/2008  Findings: Upper-normal size of cardiac silhouette. Mediastinal contours and pulmonary vascularity normal. Peribronchial thickening. No pulmonary infiltrate, pleural effusion, or pneumothorax. No acute osseous findings.  IMPRESSION: Minimal bronchitic changes.  Original Report Authenticated By: Lollie Marrow, M.D.   Ct Head Wo Contrast  07/08/2011  *RADIOLOGY REPORT*  Clinical Data: Seizure.  CT HEAD WITHOUT CONTRAST  Technique:  Contiguous axial images were obtained from the base of the skull through the vertex without contrast.  Comparison: None.  Findings: The brain has a normal appearance without evidence of atrophy, old or acute infarction, mass lesion, hemorrhage, hydrocephalus or extra-axial collection.  Calvarium is unremarkable.  Sinuses, middle ears and mastoids are clear.  IMPRESSION: Normal head CT  Original Report Authenticated By: Thomasenia Sales, M.D.    Labs  CBC  Lab 07/09/11 0630 07/08/11 1306  WBC 15.2* 21.0*  HGB 9.6* 11.1*  HCT 31.4* 35.8*  PLT 258 295   BMET  Lab 07/09/11 0630 07/08/11 1306  NA 139 136  K 3.9 3.5  CL 106 100  CO2 24 24  BUN 10 8  CREATININE 0.79 0.80  CALCIUM 8.8 9.0  PROT -- 8.0  BILITOT -- 0.3  ALKPHOS -- 116  ALT -- 18  AST -- 19  GLUCOSE 223* 350*   Results for orders placed during the hospital  encounter of 07/08/11 (from the past 72 hour(s))  GLUCOSE, CAPILLARY     Status: Abnormal   Collection Time   07/08/11 12:13 PM      Component Value Range Comment   Glucose-Capillary 342 (*) 70 - 99 (mg/dL)   COMPREHENSIVE METABOLIC PANEL     Status: Abnormal   Collection Time   07/08/11  1:06 PM      Component Value Range Comment   Sodium 136  135 - 145 (mEq/L)    Potassium 3.5  3.5 - 5.1 (mEq/L)    Chloride 100  96 - 112 (mEq/L)    CO2 24  19 - 32 (mEq/L)    Glucose, Bld 350 (*) 70 - 99 (mg/dL)    BUN 8  6 - 23 (mg/dL)    Creatinine, Ser 1.61  0.50 - 1.10 (mg/dL)    Calcium 9.0  8.4 - 10.5 (mg/dL)    Total Protein 8.0  6.0 - 8.3 (g/dL)    Albumin 3.5  3.5 - 5.2 (g/dL)    AST 19  0 - 37 (U/L)    ALT 18  0 - 35 (U/L)    Alkaline Phosphatase 116  39 - 117 (U/L)    Total Bilirubin 0.3  0.3 - 1.2 (mg/dL)    GFR calc non Af Amer 84 (*) >90 (mL/min)    GFR calc Af Amer >90  >90 (mL/min)   CBC     Status: Abnormal   Collection Time   07/08/11  1:06 PM      Component Value Range Comment   WBC 21.0 (*) 4.0 - 10.5 (K/uL)    RBC 5.31 (*) 3.87 - 5.11 (MIL/uL)    Hemoglobin 11.1 (*) 12.0 - 15.0 (g/dL)    HCT 09.6 (*) 04.5 - 46.0 (%)    MCV 67.4 (*) 78.0 - 100.0 (fL)    MCH 20.9 (*) 26.0 - 34.0 (pg)    MCHC 31.0  30.0 - 36.0 (g/dL)    RDW 40.9 (*) 81.1 - 15.5 (%)    Platelets 295  150 - 400 (K/uL)   DIFFERENTIAL     Status: Abnormal   Collection Time   07/08/11  1:06 PM      Component Value Range Comment   Neutrophils Relative 82 (*) 43 - 77 (%)    Lymphocytes Relative 12  12 - 46 (%)    Monocytes Relative 6  3 - 12 (%)    Eosinophils Relative 0  0 - 5 (%)    Basophils Relative 0  0 - 1 (%)    Neutro Abs 17.2 (*) 1.7 - 7.7 (K/uL)    Lymphs Abs 2.5  0.7 - 4.0 (K/uL)    Monocytes Absolute 1.3 (*) 0.1 - 1.0 (K/uL)    Eosinophils Absolute 0.0  0.0 - 0.7 (K/uL)    Basophils Absolute 0.0  0.0 - 0.1 (K/uL)    Smear Review LARGE PLATELETS PRESENT     URINALYSIS, ROUTINE W REFLEX  MICROSCOPIC     Status: Abnormal   Collection Time   07/08/11  1:22 PM      Component Value Range Comment   Color, Urine YELLOW  YELLOW     APPearance CLOUDY (*) CLEAR     Specific Gravity, Urine 1.017  1.005 -  1.030     pH 6.5  5.0 - 8.0     Glucose, UA >1000 (*) NEGATIVE (mg/dL)    Hgb urine dipstick NEGATIVE  NEGATIVE     Bilirubin Urine NEGATIVE  NEGATIVE     Ketones, ur 15 (*) NEGATIVE (mg/dL)    Protein, ur NEGATIVE  NEGATIVE (mg/dL)    Urobilinogen, UA 1.0  0.0 - 1.0 (mg/dL)    Nitrite NEGATIVE  NEGATIVE     Leukocytes, UA MODERATE (*) NEGATIVE    URINE MICROSCOPIC-ADD ON     Status: Abnormal   Collection Time   07/08/11  1:22 PM      Component Value Range Comment   Squamous Epithelial / LPF FEW (*) RARE     WBC, UA 3-6  <3 (WBC/hpf)    Bacteria, UA FEW (*) RARE    GLUCOSE, CAPILLARY     Status: Abnormal   Collection Time   07/08/11  3:02 PM      Component Value Range Comment   Glucose-Capillary 316 (*) 70 - 99 (mg/dL)    Comment 1 Documented in Chart      Comment 2 Notify RN     CARDIAC PANEL(CRET KIN+CKTOT+MB+TROPI)     Status: Normal   Collection Time   07/08/11  3:28 PM      Component Value Range Comment   Total CK 175  7 - 177 (U/L)    CK, MB 1.6  0.3 - 4.0 (ng/mL)    Troponin I <0.30  <0.30 (ng/mL)    Relative Index 0.9  0.0 - 2.5    GLUCOSE, CAPILLARY     Status: Abnormal   Collection Time   07/08/11  5:08 PM      Component Value Range Comment   Glucose-Capillary 285 (*) 70 - 99 (mg/dL)    Comment 1 Notify RN     GLUCOSE, CAPILLARY     Status: Abnormal   Collection Time   07/08/11  7:57 PM      Component Value Range Comment   Glucose-Capillary 214 (*) 70 - 99 (mg/dL)    Comment 1 Notify RN     TSH     Status: Normal   Collection Time   07/08/11  9:38 PM      Component Value Range Comment   TSH 1.083  0.350 - 4.500 (uIU/mL)   HEMOGLOBIN A1C     Status: Abnormal   Collection Time   07/08/11  9:38 PM      Component Value Range Comment   Hemoglobin A1C  10.1 (*) <5.7 (%)    Mean Plasma Glucose 243 (*) <117 (mg/dL)   CARDIAC PANEL(CRET KIN+CKTOT+MB+TROPI)     Status: Abnormal   Collection Time   07/08/11 11:33 PM      Component Value Range Comment   Total CK 208 (*) 7 - 177 (U/L)    CK, MB 2.1  0.3 - 4.0 (ng/mL)    Troponin I <0.30  <0.30 (ng/mL)    Relative Index 1.0  0.0 - 2.5    URINE RAPID DRUG SCREEN (HOSP PERFORMED)     Status: Normal   Collection Time   07/09/11  4:49 AM      Component Value Range Comment   Opiates NONE DETECTED  NONE DETECTED     Cocaine NONE DETECTED  NONE DETECTED     Benzodiazepines NONE DETECTED  NONE DETECTED     Amphetamines NONE DETECTED  NONE DETECTED     Tetrahydrocannabinol NONE DETECTED  NONE DETECTED     Barbiturates NONE DETECTED  NONE DETECTED    CBC     Status: Abnormal   Collection Time   07/09/11  6:30 AM      Component Value Range Comment   WBC 15.2 (*) 4.0 - 10.5 (K/uL)    RBC 4.68  3.87 - 5.11 (MIL/uL)    Hemoglobin 9.6 (*) 12.0 - 15.0 (g/dL)    HCT 16.1 (*) 09.6 - 46.0 (%)    MCV 67.1 (*) 78.0 - 100.0 (fL)    MCH 20.5 (*) 26.0 - 34.0 (pg)    MCHC 30.6  30.0 - 36.0 (g/dL)    RDW 04.5 (*) 40.9 - 15.5 (%)    Platelets 258  150 - 400 (K/uL)   BASIC METABOLIC PANEL     Status: Abnormal   Collection Time   07/09/11  6:30 AM      Component Value Range Comment   Sodium 139  135 - 145 (mEq/L)    Potassium 3.9  3.5 - 5.1 (mEq/L)    Chloride 106  96 - 112 (mEq/L)    CO2 24  19 - 32 (mEq/L)    Glucose, Bld 223 (*) 70 - 99 (mg/dL)    BUN 10  6 - 23 (mg/dL)    Creatinine, Ser 8.11  0.50 - 1.10 (mg/dL)    Calcium 8.8  8.4 - 10.5 (mg/dL)    GFR calc non Af Amer >90  >90 (mL/min)    GFR calc Af Amer >90  >90 (mL/min)   LIPID PANEL     Status: Abnormal   Collection Time   07/09/11  6:30 AM      Component Value Range Comment   Cholesterol 164  0 - 200 (mg/dL)    Triglycerides 78  <914 (mg/dL)    HDL 39 (*) >78 (mg/dL)    Total CHOL/HDL Ratio 4.2      VLDL 16  0 - 40 (mg/dL)    LDL  Cholesterol 295 (*) 0 - 99 (mg/dL)   GLUCOSE, CAPILLARY     Status: Abnormal   Collection Time   07/09/11  7:35 AM      Component Value Range Comment   Glucose-Capillary 220 (*) 70 - 99 (mg/dL)    Comment 1 Notify RN     GLUCOSE, CAPILLARY     Status: Abnormal   Collection Time   07/09/11 12:22 PM      Component Value Range Comment   Glucose-Capillary 241 (*) 70 - 99 (mg/dL)    Comment 1 Notify RN          Patient condition at time of discharge/disposition: stable  Disposition-home   Follow up issues: 1. Tell patient she can drive if wasn't reached before visit.  2. Follow up diabetes education. Patient was supposed to be called for outpatient education as order placed while in hospital.  3. Make sure patient has a meter for home use.  4. See how patient is tolerating statin, glipizide, and metformin. Follow up blood sugars if already has meter.  5. Follow up urine culture.  6. Follow up smoking cessation.   Discharge follow up:  Follow-up Information    Follow up with Tana Conch, MD on 07/20/2011. (1:30 pm)    Contact information:   [address] [place] [address] [phone]         Discharge Orders    Future Appointments: Provider: Department: Dept Phone: Center:   07/20/2011 2:00 PM Jeannett Senior  Durene Cal, MD [facility] [phone] [facility]     Future Orders Please Complete By Expires   Diet Carb Modified      Increase activity slowly      Driving Restrictions      Comments:   No driving until told by physician.   Other Restrictions      Comments:   Don't climb high heights or stand in standing water until told by physician.   Call MD for:  persistant nausea and vomiting      Call MD for:  persistant dizziness or light-headedness      Call MD for:      Scheduling Instructions:   Make sure to drink plenty of water once you leave the hospital. At least 64 oz per day. Please also call doctor if you have any more fainting episodes.       Discharge Medications  Vonnetta, Akey  Home Medication Instructions AVW:[id number]   Printed on:07/09/11 2152  Medication Information                    glipiZIDE (GLUCOTROL) 10 MG tablet Take 1 tablet (10 mg total) by mouth daily before breakfast.           metFORMIN (GLUCOPHAGE) 500 MG tablet Take 1 tablet (500 mg total) by mouth 2 (two) times daily with a meal.           simvastatin (ZOCOR) 20 MG tablet Take 1 tablet (20 mg total) by mouth daily at 6 PM.           aspirin EC 81 MG tablet Take 1 tablet (81 mg total) by mouth daily.           Blood Glucose Monitoring Suppl (ONE TOUCH ULTRA SYSTEM KIT) W/DEVICE KIT 1 kit by Does not apply route once. May dispense generic as covered by insurance                Tana Conch, MD of [facility] 07/09/2011 9:52 PM

## 2011-07-09 NOTE — Progress Notes (Signed)
07-09-11 UR completed. Ronny Flurry RN BSN

## 2011-07-09 NOTE — Progress Notes (Addendum)
Consult received.  Patient with new onset diabetes.  A1C=10.1% indicating average CBG=243 mg/dL.  She states that it has been a while since she has seen a physician. Reviewed S&S of hyperglycemia and she states she experienced many of them.  She has 2 aunts and cousins who also have diabetes. Briefly reviewed ABC's of diabetes including the importance of controlling CBG's, Blood pressure, and cholesterol.  Also reviewed S&S and treatment of hypoglycemia.  Will ask MD to order glucose meter for discharge.  Instructed her to purchase meter based on what her insurance covers.  Seems motivated to learn and she wants to go to outpatient diabetes education. Will have them call her for appt. Briefly discussed diet, exercise and foot care.  To follow-up with [facility] family practice. Gave her "living well with diabetes" booklet.  To be discharged home on oral agents at this time.  Discussed A1C results and what normal blood glucose levels are (80-120 mg/dL).  1445 Spoke to MD, order sent to outpatient pharmacy for glucose meter.  She has appt. to follow-up with MD on January 28th, 2013.

## 2011-07-09 NOTE — Progress Notes (Signed)
Pt is a newly diagnosed diabetic. Smokes 1 ppd . She wants to quit and is in action stage. Recommended 21 mg patch x 6 weeks, 14 mg patch x 2 weeks and 7 mg patch x 2 weeks. Discussed patch use instructions. Referred to 1-800 quit now for f/u and support. Discussed oral fixation substitutes, second hand smoke and in home smoking policy. Reviewed and gave pt Written education/contact information.

## 2011-07-09 NOTE — Progress Notes (Signed)
I discussed with Dr Shawnie Pons.  I agree with their plans documented in their  Note for today.

## 2011-07-09 NOTE — Progress Notes (Signed)
PGY-1 Daily Progress Note Family Medicine Teaching Service Alexandra Wilson. Alexandra Sleigh, MD Service Pager: [phone]   Subjective: feels much better this morning. No CP/SOB. No more lightheadedness.   Objective:  Temp:  [97 F (36.1 C)-98.4 F (36.9 C)] 98.4 F (36.9 C) (01/17 0500) Pulse Rate:  [71-108] 80  (01/17 0500) Resp:  [16-18] 18  (01/17 0500) BP: (98-119)/(60-77) 111/73 mmHg (01/17 0500) SpO2:  [96 %-100 %] 96 % (01/17 0500) Weight:  [207 lb 3.7 oz (94 kg)] 207 lb 3.7 oz (94 kg) (01/16 1700) No intake or output data in the 24 hours ending 07/09/11 0756  Constitutional: She is oriented to person, place, and time. She appears well-developed and well-nourished. No distress.  HENT:  Mouth/Throat: No oropharyngeal exudate. Moist membranes now moist.  Eyes: Pupils are equal, round, and reactive to light.  Neck: Normal range of motion. Neck supple.  Cardiovascular: Normal rate and regular rhythm. Exam reveals no gallop and no friction rub.  No murmur heard.  Respiratory: Effort normal and breath sounds normal. No respiratory distress. She has no wheezes. She has no rales.  GI: Soft. Bowel sounds are normal. She exhibits no distension. There is no tenderness. There is no rebound.  Musculoskeletal: Normal range of motion. Edema: trace bilateral ankle edema.  Neurological: She is alert and oriented to person, place, and time. She displays normal reflexes. She exhibits normal muscle tone. Coordination normal.  5/5 muscle strength bilateral upper extremities. No focal deficits. Sensation intact.    Labs and imaging:   CBC  Lab 07/09/11 0630 07/08/11 1306  WBC 15.2* 21.0*  HGB 9.6* 11.1*  HCT 31.4* 35.8*  PLT 258 295   BMET  Lab 07/09/11 0630 07/08/11 1306  NA 139 136  K 3.9 3.5  CL 106 100  CO2 24 24  BUN 10 8  CREATININE 0.79 0.80  LABGLOM -- --  GLUCOSE 223* --  CALCIUM 8.8 9.0   DIFFERENTIAL     Status: Abnormal   Collection Time   07/08/11  1:06 PM      Component  Value Range   Neutrophils Relative 82 (*) 43 - 77 (%)   Lymphocytes Relative 12  12 - 46 (%)   Monocytes Relative 6  3 - 12 (%)   Eosinophils Relative 0  0 - 5 (%)   Basophils Relative 0  0 - 1 (%)   Neutro Abs 17.2 (*) 1.7 - 7.7 (K/uL)   Lymphs Abs 2.5  0.7 - 4.0 (K/uL)   Monocytes Absolute 1.3 (*) 0.1 - 1.0 (K/uL)   Eosinophils Absolute 0.0  0.0 - 0.7 (K/uL)   Basophils Absolute 0.0  0.0 - 0.1 (K/uL)   Smear Review LARGE PLATELETS PRESENT    URINALYSIS, ROUTINE W REFLEX MICROSCOPIC     Status: Abnormal   Collection Time   07/08/11  1:22 PM      Component Value Range   Color, Urine YELLOW  YELLOW    APPearance CLOUDY (*) CLEAR    Specific Gravity, Urine 1.017  1.005 - 1.030    pH 6.5  5.0 - 8.0    Glucose, UA >1000 (*) NEGATIVE (mg/dL)   Hgb urine dipstick NEGATIVE  NEGATIVE    Bilirubin Urine NEGATIVE  NEGATIVE    Ketones, ur 15 (*) NEGATIVE (mg/dL)   Protein, ur NEGATIVE  NEGATIVE (mg/dL)   Urobilinogen, UA 1.0  0.0 - 1.0 (mg/dL)   Nitrite NEGATIVE  NEGATIVE    Leukocytes, UA MODERATE (*) NEGATIVE  URINE MICROSCOPIC-ADD ON     Status: Abnormal   Collection Time   07/08/11  1:22 PM      Component Value Range   Squamous Epithelial / LPF FEW (*) RARE    WBC, UA 3-6  <3 (WBC/hpf)   Bacteria, UA FEW (*) RARE   HEMOGLOBIN A1C     Status: Abnormal   Collection Time   07/08/11  9:38 PM      Component Value Range   Hemoglobin A1C 10.1 (*) <5.7 (%)   Mean Plasma Glucose 243 (*) <117 (mg/dL)  URINE RAPID DRUG SCREEN (HOSP PERFORMED)     Status: Normal   Collection Time   07/09/11  4:49 AM      Component Value Range   Opiates NONE DETECTED  NONE DETECTED    Cocaine NONE DETECTED  NONE DETECTED    Benzodiazepines NONE DETECTED  NONE DETECTED    Amphetamines NONE DETECTED  NONE DETECTED    Tetrahydrocannabinol NONE DETECTED  NONE DETECTED    Barbiturates NONE DETECTED  NONE DETECTED    Dg Chest 2 View  07/08/2011  *RADIOLOGY REPORT*  Clinical Data: Syncope, smoker  CHEST - 2  VIEW  Comparison: 08/24/2008  Findings: Upper-normal size of cardiac silhouette. Mediastinal contours and pulmonary vascularity normal. Peribronchial thickening. No pulmonary infiltrate, pleural effusion, or pneumothorax. No acute osseous findings.  IMPRESSION: Minimal bronchitic changes.  Original Report Authenticated By: Lollie Marrow, M.D.   Ct Head Wo Contrast  07/08/2011  *RADIOLOGY REPORT*  Clinical Data: Seizure.  CT HEAD WITHOUT CONTRAST  Technique:  Contiguous axial images were obtained from the base of the skull through the vertex without contrast.  Comparison: None.  Findings: The brain has a normal appearance without evidence of atrophy, old or acute infarction, mass lesion, hemorrhage, hydrocephalus or extra-axial collection.  Calvarium is unremarkable.  Sinuses, middle ears and mastoids are clear.  IMPRESSION: Normal head CT  Original Report Authenticated By: Thomasenia Sales, M.D.     Assessment  51 y.o. year old female that does not have established medical care but only known medical problem of HTN presenting with syncope found to have new onset diabetes with a1c of 10.1. Patient likely with syncopal episode due to vasovagal syncope from dehydration (poor PO and increased urination from DM).   1. Syncope-CT head negative for intracranial abnormality. EKG with sinus tachycardia and no st-t wave abnormalities. DDx: dehydration causing vasovagal most likely cause as patient with poor PO throughout the morning and exam with appearance of dehydration. Orthostatics showed increased HR by 15 as well. Do not suspect seizure but in differential.CTA negative and primary seizures in 51 year old females are uncommon. Unsure if patient was incontinent of urine or just overall sweaty. *monitored on telemetry without any arrhythmias noted *hydrated with IVF  *UDS negative *cardiac enzymes x2 negative *Risk stratification : lipids with low HDL and LDL 109, a1c 10.1, tsh  wnl *see #3 for possible ID  concerns.  *EEG ordered-will not await read before discharge  *neurology consult placed   *Patient told no driving, climbing heights, or standing in standing water for at least 6 months.   2. New onset diabetes-A1c 10.1. SSI. Ordered inpatient diabetes counselor to see patient. Patient ok with metformin and willing to do outpatient counseling and lifestyle changes.   3. HLD-will start low dose statin at discharge  4. Tobacco abuse-encouraged smoking cessation and ordered smoking cessation counseling.   5. Leukocytosis- afebrile and WBC trending down. UA with moderate  leukocytes. Pending Urine Cx. Given syncope and tachycardia and WBC count. Would have low threshold to get blood cultures. CXR with minimal bronchitic changes.   6. Hx HTN-currently with blood pressures not elevated.   FEN/GI-carb modified diet. NS 100 ml/hr  PPx-heparin SQ  Disposition-pending further work up/evaluation     Tana Conch, MD PGY1, Family Medicine Teaching Service [phone]

## 2011-07-10 ENCOUNTER — Telehealth: Payer: Self-pay | Admitting: Family Medicine

## 2011-07-10 NOTE — Telephone Encounter (Signed)
Attempted to call patient to tell her that EEG did not show any seizure activity and she is safe to drive based off of these results.   Tana Conch, MD, PGY1 07/10/2011 2:25 PM

## 2011-07-10 NOTE — Discharge Summary (Signed)
I discussed with Dr Hunter.  I agree with their plans documented in their  Note for today.  

## 2011-07-14 ENCOUNTER — Telehealth: Payer: Self-pay | Admitting: *Deleted

## 2011-07-14 NOTE — Telephone Encounter (Signed)
Returned call to patient per message left on our voicemail.  Patient was in hospital for "observation due to seizures."  Has follow-up appt with Dr. Durene Cal for 07/20/11.  Not sure if she is able to drive or return to work.  Patient informed that Dr. Durene Cal attempted to call her on 07/10/11 with  EEG results that did not show any seizure activity and she is safe to drive based off of these results.  Spoke with Dr. Durene Cal.  Patient does not have any restrictions and ok to return to work today.  Work note placed at front desk for patient to pick up tomorrow or she will call us with fax number.  Gaylene Brooks, RN

## 2011-07-15 NOTE — Procedures (Signed)
EEG ID:  [id number].  HISTORY:  This is a woman referred for rule out seizures.  MEDICATIONS:  None.  CONDITION OF RECORDING:  This 16-lead EEG was recorded with the patient in awake, drowsy, and sleep states.  Background rhythm: background patterns in wakefulness were well organized with a well sustained posterior dominant of 9.5 Hz, symmetrical and reactive to eye opening and closing.  Drowsiness was associated with mild attenuation of voltage and slowing frequencies.  Normal sleep patterns were seen.  Abnormal Potentials: no epileptiform activity or focal slowing was noted.  ACTIVATION PROCEDURES:  Hyperventilation did not activate tracing. Photic stimulation did not activate tracing.  EKG:  Single-channel of EKG monitoring was unremarkable.  IMPRESSION:  This is a normal awake, drowsy, and sleep EEG.  A normal EEG does not rule out the clinical diagnosis of epilepsy.  If clinically warranted, a repeat EEG or ambulatory recording may be obtained for prolonged recording times, which may increase the diagnostic yield. Clinical correlation is suggested.          ______________________________ Carmell Austria, MD    [id number] D:  07/14/2011 13:25:55  T:  07/14/2011 04:54:09  Job #:  [id number]

## 2011-07-19 ENCOUNTER — Encounter: Payer: Self-pay | Admitting: Family Medicine

## 2011-07-19 DIAGNOSIS — Z720 Tobacco use: Secondary | ICD-10-CM | POA: Insufficient documentation

## 2011-07-19 DIAGNOSIS — E119 Type 2 diabetes mellitus without complications: Secondary | ICD-10-CM | POA: Insufficient documentation

## 2011-07-19 HISTORY — DX: Type 2 diabetes mellitus without complications: E11.9

## 2011-07-20 ENCOUNTER — Ambulatory Visit (INDEPENDENT_AMBULATORY_CARE_PROVIDER_SITE_OTHER): Payer: 59 | Admitting: Family Medicine

## 2011-07-20 ENCOUNTER — Encounter: Payer: Self-pay | Admitting: Family Medicine

## 2011-07-20 VITALS — BP 124/81 | HR 111 | Temp 97.7°F | Ht 66.0 in | Wt 205.4 lb

## 2011-07-20 DIAGNOSIS — E119 Type 2 diabetes mellitus without complications: Secondary | ICD-10-CM

## 2011-07-20 DIAGNOSIS — I10 Essential (primary) hypertension: Secondary | ICD-10-CM

## 2011-07-20 DIAGNOSIS — R55 Syncope and collapse: Secondary | ICD-10-CM

## 2011-07-20 DIAGNOSIS — F17200 Nicotine dependence, unspecified, uncomplicated: Secondary | ICD-10-CM

## 2011-07-20 DIAGNOSIS — E785 Hyperlipidemia, unspecified: Secondary | ICD-10-CM

## 2011-07-20 DIAGNOSIS — Z720 Tobacco use: Secondary | ICD-10-CM

## 2011-07-20 DIAGNOSIS — R7309 Other abnormal glucose: Secondary | ICD-10-CM

## 2011-07-20 DIAGNOSIS — R739 Hyperglycemia, unspecified: Secondary | ICD-10-CM

## 2011-07-20 LAB — GLUCOSE, CAPILLARY: Glucose-Capillary: 107 mg/dL — ABNORMAL HIGH (ref 70–99)

## 2011-07-20 NOTE — Progress Notes (Signed)
  Subjective:    Patient ID: Alexandra Wilson, female    DOB: [birth year], 51 y.o.   MRN: [id number]  HPI51 y.o. year old female recently hospitalized after syncopal episode at work likely due to vasovagal syncope from dehydration (poor PO and increased urination from new onset DM) and possible myoclonic jerk here for hospital follow up.   1. Previous syncope-patient without any presyncopal events. Did have one concerning event this morning per problem #2. Patient notified by phone that she could drive due to normal EEG.    2. New onset diabetes-A1c 10.1 discovered in hospital. Patient started on metformin and glipizide. Blood Sugars per patient-none as was not able to obtain strips for onetouch ultra 2 at her pharmacy or [facility]. Patient had an event this mornign where she felt jittery, anxious, had a slight HA, became slightly dizzy, and became very hungry. Patient was not able to check blood sugar. She said she left work early and went home to lay down. She ate several snacks, laid down and began to feel better. Patient reported not taking medications this morning.  Last eye exam-patient reports will try to get into eye doctor this week Last foot exam-never. Will complete in 2 months.  ROS-denies Polyuria, Polydipsia, Neuropathy, Paresthesias, dry mouth,  Positive for-Vision changes. Patient states needs to see eye doctor as already wears glasses.   3. HLD-patient tolerating statin without side effects  4. Tobacco abuse-encouraged smoking cessation. Patient would like to focus on DM for now as information overwhelming. Understand she will be asked at each visit.   5. Hx HTN-currently with blood pressures not elevated.  Review of Systems negative except as noted in HPI     Objective:   Physical Exam  Constitutional: She is oriented to person, place, and time. She appears well-developed and well-nourished. No distress.  HENT:  Head: Normocephalic and atraumatic.  Mouth/Throat: No  oropharyngeal exudate.  Neck: Normal range of motion. Neck supple. No thyromegaly present.  Cardiovascular: Regular rhythm.  Exam reveals no gallop and no friction rub.   No murmur heard.      HR approximately 100  Pulmonary/Chest: Effort normal and breath sounds normal. No respiratory distress. She has no wheezes. She has no rales.  Abdominal: Soft. Bowel sounds are normal. She exhibits no distension. There is no tenderness.  Musculoskeletal: Normal range of motion. She exhibits no edema.  Neurological: She is alert and oriented to person, place, and time.  Skin: Skin is warm and dry.  Psychiatric: Her behavior is normal.          Assessment & Plan:

## 2011-07-20 NOTE — Assessment & Plan Note (Addendum)
Believe patient may have had an episode of hypoglycemia this morning. Patient will check CBG any symptomatic times and every AM and keep log.  Will continue glipizide at this time and provide hypoglycemia symptoms and have patient check CBG when she feels this way in future.  Gave patient hand written rx for test strips so she can check blood sugar. Patient is to follow up with the number she was given in hospital for DM classes. \\  Next visit: Follow up to see if any hypoglycemia Will check A1c in March.  Make sure patient saw eye doctor LDL goal <100. Will check June 2013.  Diabetic foot exam in 2 visits. Will devote next visit to health maintenance  Urine microalbumin next visit.  Make sure called DM educator outpatient.

## 2011-07-20 NOTE — Assessment & Plan Note (Signed)
Tolerating statin without side effects. Will check direct LDL in June of this year. Goal <100 due to DM.

## 2011-07-20 NOTE — Assessment & Plan Note (Signed)
No recurrent issues since hospitalization. Patient did appear to have an episode of hypoglycemia this morning though no meter to confirm. Had been without incident until this time since hospitalization. Will monitor.

## 2011-07-20 NOTE — Patient Instructions (Addendum)
It was great to see you again today, Alexandra Wilson!  To review: 1. I would like for you to get this new meter and test strips. I want you to check your blood sugar at least once every morning and anytime you feel "funny" which could be dizziness, anxiety, jitteriness, HA, shakes, etc. Look below for symptoms of high and low blood sugar.   A. If you have a blood sugar <70. I would like for you to take some orange juice about a 4-8oz glass or you can try a sugar packet or several  Lifesavers. If you have blood sugars that are low like this, please call our office for advice as we may tell you to stop one of your   medications.   B. Please keep a log of all of your sugars and your symptoms.    2. I would like for you to follow up with the diabetes educators. Call the number you were given. If you cannot find the number, we can reorder the outpatient education.   3. I would like to see you back in 1 month to see how things are doing. I would like to see you in 2 months to check another 3 month indicator of your blood sugar (hemoglobin a1c).   A. At your visit in 1 month, we can talk about mammogram, colonoscopy, perform a pap smear, and give you a tetanus shot.   I look forward to seeing you again,  Dr. Durene Cal.         Hypoglycemia (Low Blood Sugar) Hypoglycemia is when the glucose (sugar) in your blood is too low. Hypoglycemia can happen for many reasons. It can happen to people with or without diabetes. Hypoglycemia can develop quickly and can be a medical emergency.   CAUSES   Having hypoglycemia does not mean that you will develop diabetes. Different causes include:  Missed or delayed meals or not enough carbohydrates eaten.     Medication overdose. This could be by accident or deliberate. If by accident, your medication may need to be adjusted or changed.     Exercise or increased activity without adjustments in carbohydrates or medications.     A nerve disorder that affects body functions  like your heart rate, blood pressure and digestion (autonomic neuropathy).     A condition where the stomach muscles do not function properly (gastroparesis). Therefore, medications may not absorb properly.     The inability to recognize the signs of hypoglycemia (hypoglycemic unawareness).     Absorption of insulin - may be altered.     Alcohol consumption.     Pregnancy/menstrual cycles/postpartum. This may be due to hormones.     Certain kinds of tumors. This is very rare.  SYMPTOMS    Sweating.     Hunger.    Dizziness.    Blurred vision.     Drowsiness.    Weakness.    Headache.    Rapid heart beat.     Shakiness.    Nervousness.  DIAGNOSIS  Diagnosis is made by monitoring blood glucose in one or all of the following ways:  Fingerstick blood glucose monitoring.     Laboratory results.  TREATMENT   If you think your blood glucose is low:  Check your blood glucose, if possible. If it is less than 70 mg/dl, take one of the following:     3-4 glucose tablets.      cup juice (prefer clear like apple).      cup "regular"  soda pop.     1 cup milk.     -1 tube of glucose gel.     5-6 hard candies.     Do not over treat because your blood glucose (sugar) will only go too high.     Wait 15 minutes and recheck your blood glucose. If it is still less than 70 mg/dl (or below your target range), repeat treatment.     Eat a snack if it is more than one hour until your next meal.  Sometimes, your blood glucose may go so low that you are unable to treat yourself. You may need someone to help you. You may even pass out or be unable to swallow. This may require you to get an injection of glucagon, which raises the blood glucose. HOME CARE INSTRUCTIONS  Check blood glucose as recommended by your caregiver.     Take medication as prescribed by your caregiver.     Follow your meal plan. Do not skip meals. Eat on time.     If you are going to drink alcohol,  drink it only with meals.     Check your blood glucose before driving.     Check your blood glucose before and after exercise. If you exercise longer or different than usual, be sure to check blood glucose more frequently.     Always carry treatment with you. Glucose tablets are the easiest to carry.     Always wear medical alert jewelry or carry some form of identification that states that you have diabetes. This will alert people that you have diabetes. If you have hypoglycemia, they will have a better idea on what to do.  SEEK MEDICAL CARE IF:    You are having problems keeping your blood sugar at target range.     You are having frequent episodes of hypoglycemia.     You feel you might be having side effects from your medicines.     You have symptoms of an illness that is not improving after 3-4 days.     You notice a change in vision or a new problem with your vision.  SEEK IMMEDIATE MEDICAL CARE IF:    You are a family member or friend of a person whose blood glucose goes below 70 mg/dl and is accompanied by:     Confusion.    A change in mental status.     The inability to swallow.     Passing out.  Document Released: 06/08/2005 Document Revised: 02/18/2011 Document Reviewed: 01/31/2009 [facility] Patient Information 2012 [place], [place].       Hyperglycemia Hyperglycemia occurs when the glucose (sugar) in your blood is too high. Hyperglycemia can happen for many reasons, but it most often happens to people who do not know they have diabetes or are not managing their diabetes properly.   CAUSES   Whether you have diabetes or not, there are other causes of hyperglycemia. Hyperglycemia can occur when you have diabetes, but it can also occur in other situations that you might not be as aware of, such as: Diabetes  If you have diabetes and are having problems controlling your blood glucose, hyperglycemia could occur because of some of the following reasons:     Not  following your meal plan.     Not taking your diabetes medications or not taking it properly.     Exercising less or doing less activity than you normally do.     Being sick.  Pre-diabetes  This  cannot be ignored. Before people develop Type 2 diabetes, they almost always have "pre-diabetes." This is when your blood glucose levels are higher than normal, but not yet high enough to be diagnosed as diabetes. Research has shown that some long-term damage to the body, especially the heart and circulatory system, may already be occurring during pre-diabetes. If you take action to manage your blood glucose when you have pre-diabetes, you may delay or prevent Type 2 diabetes from developing.  Stress  If you have diabetes, you may be "diet" controlled or on oral medications or insulin to control your diabetes. However, you may find that your blood glucose is higher than usual in the hospital whether you have diabetes or not. This is often referred to as "stress hyperglycemia." Stress can elevate your blood glucose. This happens because of hormones put out by the body during times of stress. If stress has been the cause of your high blood glucose, it can be followed regularly by your caregiver. That way he/she can make sure your hyperglycemia does not continue to get worse or progress to diabetes.  Steroids  Steroids are medications that act on the infection fighting system (immune system) to block inflammation or infection. One side effect can be a rise in blood glucose. Most people can produce enough extra insulin to allow for this rise, but for those who cannot, steroids make blood glucose levels go even higher. It is not unusual for steroid treatments to "uncover" diabetes that is developing. It is not always possible to determine if the hyperglycemia will go away after the steroids are stopped. A special blood test called an A1c is sometimes done to determine if your blood glucose was elevated before the  steroids were started.  SYMPTOMS  Thirsty.     Frequent urination.     Dry mouth.     Blurred vision.     Tired or fatigue.     Weakness.    Sleepy.    Tingling in feet or leg.  DIAGNOSIS   Diagnosis is made by monitoring blood glucose in one or all of the following ways:  A1c test. This is a chemical found in your blood.     Fingerstick blood glucose monitoring.     Laboratory results.  TREATMENT   First, knowing the cause of the hyperglycemia is important before the hyperglycemia can be treated. Treatment may include, but is not be limited to:  Education.     Change or adjustment in medications.     Change or adjustment in meal plan.     Treatment for an illness, infection, etc.     More frequent blood glucose monitoring.     Change in exercise plan.     Decreasing or stopping steroids.     Lifestyle changes.  HOME CARE INSTRUCTIONS    Test your blood glucose as directed.     Exercise regularly. Your caregiver will give you instructions about exercise. Pre-diabetes or diabetes which comes on with stress is helped by exercising.     Eat wholesome, balanced meals. Eat often and at regular, fixed times. Your caregiver or nutritionist will give you a meal plan to guide your sugar intake.     Being at an ideal weight is important. If needed, losing as little as 10 to 15 pounds may help improve blood glucose levels.  SEEK MEDICAL CARE IF:    You have questions about medicine, activity, or diet.     You continue to have symptoms (problems  such as increased thirst, urination, or weight gain).  SEEK IMMEDIATE MEDICAL CARE IF:    You are vomiting or have diarrhea.     Your breath smells fruity.     You are breathing faster or slower.     You are very sleepy or incoherent.     You have numbness, tingling, or pain in your feet or hands.     You have chest pain.     Your symptoms get worse even though you have been following your caregiver's orders.      If you have any other questions or concerns.  Document Released: 12/02/2000 Document Revised: 02/18/2011 Document Reviewed: 01/28/2009 [facility] Patient Information 2012 [place], [place].

## 2011-07-20 NOTE — Assessment & Plan Note (Signed)
Not willing to quit at this time. Will readdress at each visit. Patient wants to focus on DM for now.

## 2011-07-20 NOTE — Assessment & Plan Note (Signed)
Diagnosed in past. No current evidence. Will monitor.

## 2011-08-17 ENCOUNTER — Encounter: Payer: Self-pay | Admitting: Family Medicine

## 2011-08-17 ENCOUNTER — Ambulatory Visit (INDEPENDENT_AMBULATORY_CARE_PROVIDER_SITE_OTHER): Payer: 59 | Admitting: Family Medicine

## 2011-08-17 ENCOUNTER — Other Ambulatory Visit (HOSPITAL_COMMUNITY)
Admission: RE | Admit: 2011-08-17 | Discharge: 2011-08-17 | Disposition: A | Discharge status: Home / Self Care | Payer: 59 | Source: Ambulatory Visit | Attending: Family Medicine | Admitting: Family Medicine

## 2011-08-17 DIAGNOSIS — N760 Acute vaginitis: Secondary | ICD-10-CM

## 2011-08-17 DIAGNOSIS — IMO0001 Reserved for inherently not codable concepts without codable children: Secondary | ICD-10-CM

## 2011-08-17 DIAGNOSIS — F17200 Nicotine dependence, unspecified, uncomplicated: Secondary | ICD-10-CM

## 2011-08-17 DIAGNOSIS — E785 Hyperlipidemia, unspecified: Secondary | ICD-10-CM

## 2011-08-17 DIAGNOSIS — I10 Essential (primary) hypertension: Secondary | ICD-10-CM

## 2011-08-17 DIAGNOSIS — Z113 Encounter for screening for infections with a predominantly sexual mode of transmission: Secondary | ICD-10-CM | POA: Insufficient documentation

## 2011-08-17 DIAGNOSIS — Z720 Tobacco use: Secondary | ICD-10-CM

## 2011-08-17 DIAGNOSIS — B373 Candidiasis of vulva and vagina: Secondary | ICD-10-CM

## 2011-08-17 DIAGNOSIS — L97509 Non-pressure chronic ulcer of other part of unspecified foot with unspecified severity: Secondary | ICD-10-CM

## 2011-08-17 DIAGNOSIS — Z23 Encounter for immunization: Secondary | ICD-10-CM

## 2011-08-17 DIAGNOSIS — L97409 Non-pressure chronic ulcer of unspecified heel and midfoot with unspecified severity: Secondary | ICD-10-CM

## 2011-08-17 DIAGNOSIS — Z01419 Encounter for gynecological examination (general) (routine) without abnormal findings: Secondary | ICD-10-CM | POA: Insufficient documentation

## 2011-08-17 DIAGNOSIS — Z8742 Personal history of other diseases of the female genital tract: Secondary | ICD-10-CM

## 2011-08-17 DIAGNOSIS — Z124 Encounter for screening for malignant neoplasm of cervix: Secondary | ICD-10-CM

## 2011-08-17 DIAGNOSIS — E119 Type 2 diabetes mellitus without complications: Secondary | ICD-10-CM

## 2011-08-17 DIAGNOSIS — Z20828 Contact with and (suspected) exposure to other viral communicable diseases: Secondary | ICD-10-CM | POA: Insufficient documentation

## 2011-08-17 DIAGNOSIS — Z87898 Personal history of other specified conditions: Secondary | ICD-10-CM

## 2011-08-17 DIAGNOSIS — B070 Plantar wart: Secondary | ICD-10-CM | POA: Insufficient documentation

## 2011-08-17 LAB — POCT WET PREP (WET MOUNT)

## 2011-08-17 NOTE — Patient Instructions (Signed)
Dear *Mrs. Orvan July,   It was great to see you today. Thank you for coming to clinic. Please read below regarding the issues that we discussed.   1. Please call to get your mammogram and colonoscopy scheduled given the sheets we gave you. 2. Continue to take your medications for your diabetes. Please check your blood sugar at least twice a week in the morning and record the sugars. Also, record anytime you have other concerning symptoms that we discussed.  -also have the records from your eye doctor faxed to [place] [phone].  -finally we did a lab test to check for protein in your urine related to your diabetes -your diabetic foot exam was normal. Make sure to look at your feet daily and specifically the small area/ulcer that has been there for 20 years.  3. For your blood pressure, we will recheck at the next visit.  4. For your blood pressure and diabetes, please begin to walk 30 minutes 3x per week to start with. We will discuss increasing.  5. Please consider quitting smoking and let us know when you are ready to talk to [place] about this as we can help.  6. We did a pap smear today, we will let you know if you need to be seen regarding results.  7. We also tested you for several infections today. We can discuss your results at your next visit. If anything is positive, I will call you.   Please follow up in clinic in 6 weeks . Please call earlier if you have any questions or concerns.   Sincerely,  Dr. Tana Conch

## 2011-08-17 NOTE — Assessment & Plan Note (Addendum)
Well controlled previously. Mildly elevated today. Will recheck at next visit. Will also try lifestyle changes-encouraged especially through DM education. Patient also agrees to walking 30 minutes 3x per week.

## 2011-08-17 NOTE — Assessment & Plan Note (Addendum)
A1c in march. LDL in June 2013. Encouraged patient to inspect feet daily and specifically to watch ulceration at base of 1st and 2nd toe on L foot. Also needs to moisturize as has callous formation. Urine microalbumin today. Patient wants DM education-placed order today.

## 2011-08-17 NOTE — Progress Notes (Signed)
  Subjective:    Patient ID: Alexandra Wilson, female    DOB: [birth year], 51 y.o.   MRN: [id number]  HPI26 y.o. year old female with DM, hx HTN, smoking, HLD presenting for health maintenance and follow up of DM.    1. DIABETES Medications taking and tolerating-yes Blood Sugars per patient- typically ranging from 100-180. Patient with HA when 180. Says CBG better controlled if eating small regular meals. fasting-not recording Hypoglycemia symptoms (shaky, sweaty, hungry,  anxious, tremor, palpitations, confusion, behavior change)-none  Last eye exam-reportedly 1 week ago Last foot exam-today Last microalbumin/on ace inhibitor-today Daily foot monitoring-yes DM education-interested  ROS- (-)Polyuria, (-) Polydipsia,  (-)nocturia,  (-) Vision changes, (+ for some tingling in toes over last 3 weeks) feet or hand numbness/pain/tingling  Diabetic Labs:  Lab Results  Component Value Date   HGBA1C 10.1* 07/08/2011   Lab Results  Component Value Date   LDLCALC 109* 07/09/2011   CREATININE 0.79 07/09/2011    2. HLD-patient tolerating statin without side effects.   3. Tobacco abuse-encouraged smoking cessation. Once again, Patient would like to focus on DM for now as information overwhelming. Understand she will be asked at each visit.   4. Hx HTN-mildly elevated today with manual repeat of 130/90.   5. Health maint-info on mammogram and colonoscopy given. Pap completed and TDap given today. Reports history of abnormal paps but repeat paps always not requiring intervention. 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 Would like testing today.   Review of Systems negative except as noted in HPI     Objective:   Physical Exam  Constitutional: She is oriented to person, place, and time. She appears well-developed and well-nourished. No distress.  HENT:  Head: Normocephalic and atraumatic.  Mouth/Throat: Oropharynx is  clear and moist. No oropharyngeal exudate.  Eyes: Conjunctivae and EOM are normal. Pupils are equal, round, and reactive to light.  Neck: Normal range of motion. Neck supple.  Cardiovascular: Normal rate and regular rhythm.  Exam reveals no gallop and no friction rub.   No murmur heard. Pulmonary/Chest: Effort normal and breath sounds normal. No respiratory distress. She has no wheezes. She has no rales.  Abdominal: Soft. Bowel sounds are normal. She exhibits no distension. There is no tenderness.  Genitourinary: Uterus normal. Cervix exhibits discharge. Cervix exhibits no motion tenderness. Right adnexum displays no mass, no tenderness and no fullness. Left adnexum displays no mass, no tenderness and no fullness. Vaginal discharge (thick cottage cheese like) found.    Musculoskeletal: Normal range of motion. She exhibits edema (trace).  Neurological: She is alert and oriented to person, place, and time.  Skin: Skin is warm and dry.      Assessment & Plan:  -info on mammogram and colonoscopy given. Pap completed and TDap given today.

## 2011-08-17 NOTE — Assessment & Plan Note (Signed)
Not willing to quit at this time. Readdress at each visit.

## 2011-08-17 NOTE — Assessment & Plan Note (Signed)
HIV/RPR/GC/Chlamydia today.

## 2011-08-17 NOTE — Assessment & Plan Note (Signed)
LDL in June of this year. Goal LDL <100 due to DM.

## 2011-08-18 LAB — HIV ANTIBODY (ROUTINE TESTING W REFLEX): HIV: NONREACTIVE

## 2011-08-21 DIAGNOSIS — B373 Candidiasis of vulva and vagina: Secondary | ICD-10-CM | POA: Insufficient documentation

## 2011-08-21 DIAGNOSIS — Z87898 Personal history of other specified conditions: Secondary | ICD-10-CM | POA: Insufficient documentation

## 2011-08-21 MED ORDER — FLUCONAZOLE 150 MG PO TABS: 150.0000 mg | ORAL_TABLET | Freq: Once | ORAL | Status: AC

## 2011-08-21 NOTE — Assessment & Plan Note (Signed)
Yeast infection on wet prep and seen on pap. Patient says continues to have discharge on f/u phone call on 3/1. Will send in prescription for fluconazole 150 PO x1. Also updated patient on other labs during this phone call.

## 2011-08-21 NOTE — Progress Notes (Signed)
Addended by: Shelva Majestic on: 08/21/2011 05:25 PM   Modules accepted: Orders

## 2011-08-26 ENCOUNTER — Encounter: Payer: Self-pay | Admitting: Gastroenterology

## 2011-08-26 ENCOUNTER — Other Ambulatory Visit: Payer: Self-pay | Admitting: Family Medicine

## 2011-08-26 DIAGNOSIS — Z1231 Encounter for screening mammogram for malignant neoplasm of breast: Secondary | ICD-10-CM

## 2011-09-09 ENCOUNTER — Ambulatory Visit
Admission: RE | Admit: 2011-09-09 | Discharge: 2011-09-09 | Disposition: A | Discharge status: Home / Self Care | Payer: 59 | Source: Ambulatory Visit | Attending: Family Medicine | Admitting: Family Medicine

## 2011-09-09 DIAGNOSIS — Z1231 Encounter for screening mammogram for malignant neoplasm of breast: Secondary | ICD-10-CM

## 2011-09-11 ENCOUNTER — Encounter: Payer: Self-pay | Admitting: Family Medicine

## 2011-09-11 DIAGNOSIS — R922 Inconclusive mammogram: Secondary | ICD-10-CM | POA: Insufficient documentation

## 2011-09-14 ENCOUNTER — Ambulatory Visit (AMBULATORY_SURGERY_CENTER): Payer: 59 | Admitting: *Deleted

## 2011-09-14 VITALS — Ht 67.0 in | Wt 205.0 lb

## 2011-09-14 DIAGNOSIS — Z1211 Encounter for screening for malignant neoplasm of colon: Secondary | ICD-10-CM

## 2011-09-14 MED ORDER — PEG-KCL-NACL-NASULF-NA ASC-C 100 G PO SOLR: ORAL | Status: DC

## 2011-09-15 ENCOUNTER — Encounter: Payer: Self-pay | Admitting: Gastroenterology

## 2011-09-23 ENCOUNTER — Encounter: Payer: Self-pay | Admitting: Gastroenterology

## 2011-09-23 ENCOUNTER — Ambulatory Visit (AMBULATORY_SURGERY_CENTER): Payer: 59 | Admitting: Gastroenterology

## 2011-09-23 VITALS — BP 133/83 | HR 87 | Temp 95.5°F | Resp 21 | Ht 67.0 in | Wt 205.0 lb

## 2011-09-23 DIAGNOSIS — Z1211 Encounter for screening for malignant neoplasm of colon: Secondary | ICD-10-CM

## 2011-09-23 DIAGNOSIS — D126 Benign neoplasm of colon, unspecified: Secondary | ICD-10-CM

## 2011-09-23 LAB — GLUCOSE, CAPILLARY
Glucose-Capillary: 119 mg/dL — ABNORMAL HIGH (ref 70–99)
Glucose-Capillary: 107 mg/dL — ABNORMAL HIGH (ref 70–99)

## 2011-09-23 MED ORDER — SODIUM CHLORIDE 0.9 % IV SOLN: 500.0000 mL | INTRAVENOUS | Status: DC

## 2011-09-23 NOTE — Progress Notes (Signed)
Patient did not have preoperative order for IV antibiotic SSI prophylaxis. (G8918)  Patient did not experience any of the following events: a burn prior to discharge; a fall within the facility; wrong site/side/patient/procedure/implant event; or a hospital transfer or hospital admission upon discharge from the facility. (G8907)  

## 2011-09-23 NOTE — Patient Instructions (Signed)

## 2011-09-23 NOTE — Op Note (Signed)
[facility] [address] [place], [place]  [address]  COLONOSCOPY PROCEDURE REPORT  PATIENT:  Alexandra Wilson, Alexandra Wilson  MR#:  [id number] BIRTHDATE:  Feb 14, 1961, 50 yrs. old  GENDER:  female ENDOSCOPIST:  Vania Rea. Jarold Motto, MD, [facility] REF. BY: PROCEDURE DATE:  09/23/2011 PROCEDURE:  Colonoscopy with snare polypectomy ASA CLASS:  Class II INDICATIONS:  Routine Risk Screening MEDICATIONS:   propofol (Diprivan) 150 mg IV  DESCRIPTION OF PROCEDURE:   After the risks and benefits and of the procedure were explained, informed consent was obtained. Digital rectal exam was performed and revealed no abnormalities. The LB 180AL [id number] endoscope was introduced through the anus and advanced to the cecum, which was identified by both the appendix and ileocecal valve.  The quality of the prep was poor, using MoviPrep.  The instrument was then slowly withdrawn as the colon was fully examined. <<PROCEDUREIMAGES>>  FINDINGS:  Poor prep limited this examination.  A sessile polyp was found in the descending colon. 5 mm polyp hot snare removed.not recovered per poor prep.   Retroflexed views in the rectum revealed no abnormalities.    The scope was then withdrawn from the patient and the procedure completed.  COMPLICATIONS:  None ENDOSCOPIC IMPRESSION: 1) Poor prep (exam limited) 2) Sessile polyp in the descending colon RECOMMENDATIONS: 1) Repeat Colonoscopy in 5 years. "double prep" with next exam.!!!  REPEAT EXAM:  No  ______________________________ Vania Rea. Jarold Motto, MD, [facility]  CC:  n. eSIGNED:   Vania Rea. Jazae Gandolfi at 09/23/2011 11:43 AM  Orvan July, [id number]

## 2011-09-24 ENCOUNTER — Telehealth: Payer: Self-pay | Admitting: *Deleted

## 2011-09-24 NOTE — Telephone Encounter (Signed)
  Follow up Call-  Call back number 09/23/2011  Post procedure Call Back phone  # [phone]  Permission to leave phone message Yes     Patient questions:  Do you have a fever, pain , or abdominal swelling? no Pain Score  0 *  Have you tolerated food without any problems? yes  Have you been able to return to your normal activities? yes  Do you have any questions about your discharge instructions: Diet   no Medications  no Follow up visit  no  Do you have questions or concerns about your Care? no  Actions: * If pain score is 4 or above: No action needed, pain <4.

## 2011-09-28 ENCOUNTER — Encounter: Payer: Self-pay | Admitting: *Deleted

## 2011-09-28 ENCOUNTER — Encounter: Payer: 59 | Attending: Family Medicine | Admitting: *Deleted

## 2011-09-28 VITALS — Ht 66.0 in | Wt 203.7 lb

## 2011-09-28 DIAGNOSIS — E119 Type 2 diabetes mellitus without complications: Secondary | ICD-10-CM | POA: Insufficient documentation

## 2011-09-28 DIAGNOSIS — Z713 Dietary counseling and surveillance: Secondary | ICD-10-CM | POA: Insufficient documentation

## 2011-09-28 NOTE — Patient Instructions (Addendum)
Plan: Aim for 2-3 Carb Choices (30-45 grams) per meal, 0-1 per snack if hungry Read food labels for total carbohydrate of foods Continue to check BG daily as directed by MD, consider checking post meal occasionally

## 2011-09-28 NOTE — Progress Notes (Signed)
Medical Nutrition Therapy:  Appt start time: 1500 end time:  1600.  Assessment:  Primary concerns today: Patient here for Diabetes Education. 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  She often skips meals and gets meals or snacks from vending machine.     MEDICATIONS: see list. Diabetes medications include Metformin and Glipizide   DIETARY INTAKE: Usual eating pattern includes 3 meals and 1-2 snacks per day.  Everyday foods include good variety of all food groups.  Avoided foods include none states   24-hr recall:  B ( AM): flavored oatmeal package OR unsweetened cereal with 2% milk OR eggs and bacon  Snk ( AM): yogurt OR Fresh fruit OR cheese crackers  L ( PM): brings sandwich from home on whole wheat, [place] and cheese OR leftovers, water Snk ( PM): none unless fresh fruit or sugar free snack D ( PM): meat, vegetables and occasionally a starch, water Snk ( PM): none Beverages: water, coffee cream & Equal, diet soda occasionally  Usual physical activity: clean house often, walked on weekends unless too cold   Estimated energy needs: 1400 calories 158 g carbohydrates 105 g protein 39 g fat  Progress Towards Goal(s):  In progress.   Nutritional Diagnosis:  NB-1.1 Food and nutrition-related knowledge deficit As related to diabetes management.  As evidenced by A1c of 10.8%.    Intervention:  Nutrition counseling and diabetes education provided. Covered basic physiology of diabetes, self monitoring of BG, carb counting and reading food labels, and action of her 2 medications. Plan: Aim for 2-3 Carb Choices (30-45 grams) per meal, 0-1 per snack if hungry Read food labels for total carbohydrate of foods Continue to check BG daily as directed by MD, consider checking post meal occasionally  Handouts given during visit include:  Living Well with Diabetes  Carb Counting and Food Labels  Meal Plan Card  Monitoring/Evaluation:  Dietary  intake, exercise, reading food labels, and body weight prn.

## 2011-10-01 ENCOUNTER — Encounter: Payer: Self-pay | Admitting: *Deleted

## 2011-11-17 ENCOUNTER — Ambulatory Visit: Payer: 59 | Admitting: Family Medicine

## 2011-12-01 ENCOUNTER — Encounter: Payer: Self-pay | Admitting: Family Medicine

## 2011-12-01 ENCOUNTER — Ambulatory Visit (INDEPENDENT_AMBULATORY_CARE_PROVIDER_SITE_OTHER): Payer: 59 | Admitting: Family Medicine

## 2011-12-01 VITALS — BP 118/84 | HR 100 | Temp 97.9°F | Ht 67.0 in | Wt 203.0 lb

## 2011-12-01 DIAGNOSIS — E119 Type 2 diabetes mellitus without complications: Secondary | ICD-10-CM

## 2011-12-01 DIAGNOSIS — Z720 Tobacco use: Secondary | ICD-10-CM

## 2011-12-01 DIAGNOSIS — E785 Hyperlipidemia, unspecified: Secondary | ICD-10-CM

## 2011-12-01 DIAGNOSIS — Z23 Encounter for immunization: Secondary | ICD-10-CM

## 2011-12-01 DIAGNOSIS — F17200 Nicotine dependence, unspecified, uncomplicated: Secondary | ICD-10-CM

## 2011-12-01 DIAGNOSIS — B070 Plantar wart: Secondary | ICD-10-CM

## 2011-12-01 DIAGNOSIS — R922 Inconclusive mammogram: Secondary | ICD-10-CM

## 2011-12-01 DIAGNOSIS — I10 Essential (primary) hypertension: Secondary | ICD-10-CM

## 2011-12-01 LAB — POCT GLYCOSYLATED HEMOGLOBIN (HGB A1C): Hemoglobin A1C: 6.2

## 2011-12-01 MED ORDER — PNEUMOCOCCAL VAC POLYVALENT 25 MCG/0.5ML IJ INJ: 0.5000 mL | INJECTION | Freq: Once | INTRAMUSCULAR | Status: DC

## 2011-12-01 NOTE — Assessment & Plan Note (Signed)
Stable Will monitor 

## 2011-12-01 NOTE — Assessment & Plan Note (Signed)
Not interested in quitting. Counseled patient about cessation once again.

## 2011-12-01 NOTE — Progress Notes (Signed)
Addended by: Farrell Ours on: 12/01/2011 05:26 PM   Modules accepted: Orders

## 2011-12-01 NOTE — Patient Instructions (Signed)
Dear Alexandra Wilson,   It was great to see you today. Thank you for coming to clinic. Please read below regarding the issues that we discussed.   1. You are doing an incredible job taking control of your health care.  2. Your diabetes is under much better control now. Your a1c was 6.2 and the goal is <7. Thank you for following through with all of your appointments. Keep up the great work.  3. You got a pneumonia vaccine today.  4. For quitting smoking, remembere I am always available to talk to you about quitting. Let me know when you are ready. 1-800-quit-now is a Chief Technology Officer.   Please follow up in clinic in 3 months . Please call earlier if you have any questions or concerns.   Sincerely,  Dr. Tana Conch

## 2011-12-01 NOTE — Assessment & Plan Note (Addendum)
LDL today. Continue statin.   Addendum 12/15/2011 2:45 PM: Direct LDL at 114 on simvastatin 20mg . Increased to 40mg . LVM for patient about results. She is to call if has any questions. Sent in rx to pharmacy.

## 2011-12-01 NOTE — Assessment & Plan Note (Signed)
Much improved control with a1c 6.2. Patient has been to optho and DM nutrition counseling.  Will continue glipizide and metformin. Hypoglycemic events every other week not true events as CBG >100. Urine microalb/cr ratio today.

## 2011-12-01 NOTE — Progress Notes (Signed)
  Subjective:    Patient ID: Alexandra Wilson, female    DOB: June 15, 1961, 51 y.o.   MRN: [id number]  HPI  1. DIABETES Type II Medications taking and tolerating-yes Blood Sugars per patient-only checking with lows or at night. 130-150 1 hour after dinner.  Hypoglycemia symptoms (shaky, sweaty, hungry,  anxious, tremor, palpitations, confusion, behavior change)-every other week will experience and blood sugar always >100.   Last eye exam--feb 2013 Last foot exam-feb 2013 Last microalbumin/on ace inhibitor-will obtain today Daily foot monitoring-yes  ROS- (-)Polyuria, (-) Polydipsia,  (-)nocturia,  (-) Vision changes, (-) feet or hand numbness/pain/tingling  Diabetic Labs:  Lab Results  Component Value Date   HGBA1C 10.1* 07/08/2011   Lab Results  Component Value Date   LDLCALC 109* 07/09/2011   CREATININE 0.79 07/09/2011   Last microalbumin: No results found for this basename: MICROALBUR, [id number]   2. Tobacco abuse-rates importance as 6/10. Readiness 2/10. Says she knows she needs to quit but is not ready. Reasons 6 and not a 4: cost, bad smell, knows she needs to quit for health.   3. HLD-compliant with medications.   Review of Systems -See HPI  Past Medical History-smoking status noted: current smoker. Reviewed problem list.  Medications- reviewed and updated Chief complaint-noted      Objective:   Physical Exam Constitutional: She is oriented to person, place, and time. She appears well-developed and well-nourished. No distress.  HENT:  Head: Normocephalic and atraumatic.  Mouth/Throat: Oropharynx is clear and moist. No oropharyngeal exudate.  Eyes: Conjunctivae and EOM are normal. Pupils are equal, round, and reactive to light.  Neck: Normal range of motion. Neck supple.  Cardiovascular: Normal rate and regular rhythm.  Exam reveals no gallop and no friction rub.   No murmur heard. Pulmonary/Chest: Effort normal and breath sounds normal. No respiratory distress. She  has no wheezes. She has no rales.  Abdominal: Soft. Bowel sounds are normal. She exhibits no distension. There is no tenderness.  Musculoskeletal: Normal range of motion. No edema. Neurological: She is alert and oriented to person, place, and time.  Skin: Skin is warm and dry. Plantar wart noted-stable from previous on left foot.     Assessment & Plan:

## 2011-12-01 NOTE — Assessment & Plan Note (Signed)
Addendum says bi-rad 0. Will resolve problem.

## 2011-12-01 NOTE — Assessment & Plan Note (Signed)
Well controlled with diet modifications. Will follow.

## 2011-12-02 LAB — MICROALBUMIN / CREATININE URINE RATIO: Creatinine, Urine: 344.6 mg/dL

## 2011-12-15 MED ORDER — SIMVASTATIN 40 MG PO TABS
40.0000 mg | ORAL_TABLET | Freq: Every day | ORAL | Status: DC
Start: 2011-12-15 — End: 2013-02-02

## 2011-12-15 NOTE — Addendum Note (Signed)
Addended by: Shelva Majestic on: 12/15/2011 02:45 PM   Modules accepted: Orders

## 2012-12-29 ENCOUNTER — Other Ambulatory Visit: Payer: Self-pay

## 2013-01-20 ENCOUNTER — Telehealth: Payer: Self-pay | Admitting: Family Medicine

## 2013-01-20 NOTE — Telephone Encounter (Signed)
Yes, will need an appt before refills are given.  Once appt is made we can send in enough meds to last til then. Guled Gahan, Maryjo Rochester

## 2013-01-20 NOTE — Telephone Encounter (Signed)
Pt is requesting refills on metformin, glipizide, and simvastatin be sent to her pharmacy. She stated that if she needed an appointment let her know. since she has not been seen since 11/2011 JW

## 2013-02-02 ENCOUNTER — Encounter: Payer: Self-pay | Admitting: Family Medicine

## 2013-02-02 ENCOUNTER — Ambulatory Visit (INDEPENDENT_AMBULATORY_CARE_PROVIDER_SITE_OTHER): Payer: 59 | Admitting: Family Medicine

## 2013-02-02 ENCOUNTER — Telehealth: Payer: Self-pay | Admitting: *Deleted

## 2013-02-02 VITALS — BP 130/82 | HR 99 | Ht 66.0 in | Wt 200.0 lb

## 2013-02-02 DIAGNOSIS — F17200 Nicotine dependence, unspecified, uncomplicated: Secondary | ICD-10-CM

## 2013-02-02 DIAGNOSIS — E785 Hyperlipidemia, unspecified: Secondary | ICD-10-CM

## 2013-02-02 DIAGNOSIS — Z720 Tobacco use: Secondary | ICD-10-CM

## 2013-02-02 DIAGNOSIS — E119 Type 2 diabetes mellitus without complications: Secondary | ICD-10-CM

## 2013-02-02 DIAGNOSIS — I10 Essential (primary) hypertension: Secondary | ICD-10-CM

## 2013-02-02 LAB — LIPID PANEL
LDL Cholesterol: 127 mg/dL — ABNORMAL HIGH (ref 0–99)
Triglycerides: 81 mg/dL (ref <150)

## 2013-02-02 LAB — COMPREHENSIVE METABOLIC PANEL
Albumin: 4.1 g/dL (ref 3.5–5.2)
BUN: 6 mg/dL (ref 6–23)
CO2: 27 mEq/L (ref 19–32)
Glucose, Bld: 169 mg/dL — ABNORMAL HIGH (ref 70–99)
Sodium: 138 mEq/L (ref 135–145)
Total Bilirubin: 0.4 mg/dL (ref 0.3–1.2)
Total Protein: 7.8 g/dL (ref 6.0–8.3)

## 2013-02-02 LAB — CBC
HCT: 36.5 % (ref 36.0–46.0)
Hemoglobin: 11.1 g/dL — ABNORMAL LOW (ref 12.0–15.0)
MCH: 20.3 pg — ABNORMAL LOW (ref 26.0–34.0)
RBC: 5.47 MIL/uL — ABNORMAL HIGH (ref 3.87–5.11)

## 2013-02-02 MED ORDER — GLIPIZIDE 10 MG PO TABS: 10.0000 mg | ORAL_TABLET | Freq: Every day | ORAL | Status: DC

## 2013-02-02 MED ORDER — SIMVASTATIN 40 MG PO TABS: 40.0000 mg | ORAL_TABLET | Freq: Every day | ORAL | Status: DC

## 2013-02-02 MED ORDER — METFORMIN HCL 500 MG PO TABS: 500.0000 mg | ORAL_TABLET | Freq: Two times a day (BID) | ORAL | Status: DC

## 2013-02-02 NOTE — Patient Instructions (Addendum)
Dear Alexandra Wilson,   It was great to see you again today. WE HAVE MISSED YOU!  Thank you for coming to clinic. Please read below regarding the issues that we discussed.   1. For your diabetes, your a1c was 10.4 which is worse than a year ago. You previously had one under 7 when taking medication and making lifestyle changes. You states you know the changes you need to make including taking your medicines. I want to check in with you in 1 month. Records morning blood sugars and bring a log. Also let me know if you have any low blood sugars which we discussed.  2. Blood pressure well controlled. No current medicine needed.  3. Cholesterol-restart simvastatin. Call me if it is too expensive.  4. For quitting smoking, remembere I am always available to talk to you about quitting. Let me know when you are ready. 1-800-quit-now is a [employer].   Please follow up in clinic in 1 month. We will do a pap smear/breast exam and follow up on your diabetes (will do foot exam). Please call earlier if you have any questions or concerns.   Sincerely,  Dr. Tana Conch  Health Maintenance Due  Topic Date Due  . Ophthalmology Exam -schedule today 08/09/2012  . Foot Exam -next visit 08/16/2012  . Lipid Panel -today 11/30/2012  . Urine Microalbumin -today  11/30/2012     My 5 to Fitness!  5: fruits and vegetables per day (work on 9 per day if you are at 5)--> focus more on vegetables since you are diabetic.  4: exercise 4-5 times per week for at least 30 minutes (walking counts!) 3: meals per day (don't skip breakfast!) 2: habits to quit  -smoking  -excess alcohol use (men >2 beer/day; women >1beer/day) 1: sweet per day (2 cookies, 1 small cup of ice cream, 12 oz soda)  These are general tips for healthy living. Try to start with 1 or 2 habit TODAY and make it a part of your life for several months. You set a goal today to work on: Regular exercise 30 minutes a day x 5 days a week, as well as  going back to your diabetic diet  Once you have 1 or 2 habits down for several months, try to begin working on your next healthy habit. With every single step you take, you will be leading a healthier lifestyle!

## 2013-02-02 NOTE — Progress Notes (Signed)
  [facility] Tana Conch, MD Phone: [phone]  Subjective:   #  DIABETES Type II             # Hyperlipidemia A1c up to 1> 10 from <6.5 a year ago Medications taking and tolerating-none, not taking statin, metformin or glipizide Blood Sugars per patient->350 at times (feels weak like previously). Mornings always >200 Diet-eating regular diet, not diabetic diet Regular Exercise-not exercising  Health Maintenance Due  Topic Date Due  . Ophthalmology Exam-had ordered but due to having private insurance, patient was informed by nursing staff that she can call to schedule  08/09/2012  . Foot Exam -next visit 08/16/2012  . Lipid Panel -today 11/30/2012  . Urine Microalbumin -today  11/30/2012  On Aspirin-encouraged On statin-plan to restart Daily foot monitoring-yes  ROS- Endorses Polyuria,Polydipsia, nocturia. Chronic Vision changes (needs glasses). Denies feet or hand numbness/pain/tingling. Endorses lightheadedness with very high CBGs. Denies  Hypoglycemia symptoms (shaky, sweaty, hungry, weak anxious, tremor, palpitations, confusion, behavior change).   Hemoglobin a1c:  Lab Results  Component Value Date   HGBA1C 10.4 02/02/2013   HGBA1C 6.2 12/01/2011   HGBA1C 10.1* 07/08/2011   # Hypertension BP Readings from Last 3 Encounters:  02/02/13 130/82  12/01/11 118/84  09/23/11 133/83  Home BP monitoring-no Compliant with medications-none prescribed Denies any CP, HA, SOB, blurry vision, LE edema, orthopnea, PND.   # Tobacco abouse 1.5 PPD. Patient is interested in quitting but is not prepared to do so today due to need to start back on medications and wants to focus on those areas before tackling smoking which she uses for stress relief.   ROS--See HPI  Past Medical History Patient Active Problem List   Diagnosis Date Noted  . Type 2 diabetes mellitus 07/19/2011    Priority: High  . Tobacco abuse 07/19/2011    Priority: High  . History of  abnormal Pap smear 08/21/2011    Priority: Medium  . Hyperlipidemia 07/09/2011    Priority: Medium  . HTN (hypertension) 07/08/2011    Priority: Medium  . Plantar wart 08/17/2011    Priority: Low   Reviewed problem list.  Medications- reviewed and updated Chief complaint-noted  Objective: BP 130/82  Pulse 99  Ht 5\' 6"  (1.676 m)  Wt 200 lb (90.719 kg)  BMI 32.3 kg/m2 Gen: NAD, resting comfortably in chair, anxious about results of a1c and lab tests CV: RRR no murmurs rubs or gallops Lungs: CTAB no crackles, wheeze, rhonchi Skin: warm, dry Neuro: grossly normal, moves all extremities Feet: small plantar wart unchanged from previous about 1cm in size, otherwise feet well taken care of. Will plan for monofilament test next visit. 2+ dp pulses.   Assessment/Plan:

## 2013-02-02 NOTE — Telephone Encounter (Signed)
Tried to call pt and let her know the status of referral.  Please inform her when she calls back that since she has private insurance that she can call any eye doctor of her choice.  Her insurance doesn't require a referral for specialty.  Thanks   [employer]

## 2013-02-03 LAB — MICROALBUMIN / CREATININE URINE RATIO
Creatinine, Urine: 150.1 mg/dL
Microalb Creat Ratio: 11.9 mg/g (ref 0.0–30.0)
Microalb, Ur: 1.79 mg/dL (ref 0.00–1.89)

## 2013-02-05 ENCOUNTER — Encounter: Payer: Self-pay | Admitting: Family Medicine

## 2013-02-05 NOTE — Assessment & Plan Note (Signed)
Wants to focus on DM/HLD/lifestyle choices. Not ready to quit. Counseled on importance of cessation.

## 2013-02-05 NOTE — Assessment & Plan Note (Signed)
Poorly controlled due to noncompliance. Patient appears motivated to recapture previously well controlled a1c <6.5. WIll restart previous medicine (glipizide and metformin) and strongly advised patient to see me at a maximum every 3 months and sooner for next visit.

## 2013-02-05 NOTE — Assessment & Plan Note (Signed)
Restart previous statin. LDL elevated >100 off medication.

## 2013-02-05 NOTE — Assessment & Plan Note (Signed)
Previously diagnosed. No longer elevated. Will continue to monitor off medication.

## 2013-04-27 ENCOUNTER — Ambulatory Visit (INDEPENDENT_AMBULATORY_CARE_PROVIDER_SITE_OTHER): Payer: 59 | Admitting: Family Medicine

## 2013-04-27 ENCOUNTER — Encounter: Payer: Self-pay | Admitting: Family Medicine

## 2013-04-27 VITALS — BP 140/86 | HR 98 | Temp 98.0°F | Ht 66.0 in | Wt 205.0 lb

## 2013-04-27 DIAGNOSIS — Z720 Tobacco use: Secondary | ICD-10-CM

## 2013-04-27 DIAGNOSIS — E119 Type 2 diabetes mellitus without complications: Secondary | ICD-10-CM

## 2013-04-27 DIAGNOSIS — F17200 Nicotine dependence, unspecified, uncomplicated: Secondary | ICD-10-CM

## 2013-04-27 DIAGNOSIS — Z23 Encounter for immunization: Secondary | ICD-10-CM

## 2013-04-27 DIAGNOSIS — I10 Essential (primary) hypertension: Secondary | ICD-10-CM

## 2013-04-27 LAB — POCT GLYCOSYLATED HEMOGLOBIN (HGB A1C): Hemoglobin A1C: 7.6

## 2013-04-27 MED ORDER — METFORMIN HCL 850 MG PO TABS: 850.0000 mg | ORAL_TABLET | Freq: Two times a day (BID) | ORAL | Status: DC

## 2013-04-27 MED ORDER — GLIPIZIDE 10 MG PO TABS: 5.0000 mg | ORAL_TABLET | Freq: Two times a day (BID) | ORAL | Status: DC

## 2013-04-27 MED ORDER — SIMVASTATIN 40 MG PO TABS: 40.0000 mg | ORAL_TABLET | Freq: Every day | ORAL | Status: DC

## 2013-04-27 NOTE — Assessment & Plan Note (Signed)
a1c greatly improved. Increased meformin to 850 mg from 576m and changed glipizide to be taken at both meals 5mg  BID instead of 10mg  once daily.

## 2013-04-27 NOTE — Assessment & Plan Note (Signed)
Counseled on cessation. Not ready to quit.

## 2013-04-27 NOTE — Progress Notes (Signed)
[facility] Tana Conch, MD Phone: [phone]  Subjective:  Chief complaint-noted  # DIABETES Type II Medications taking and tolerating-yes Blood Sugars per patient-fasting-in the morning typically 140 Diet-trying to eat healthy Regular Exercise-walking 2x a week  Health Maintenance Due  Topic Date Due  . Ophthalmology Exam -will seek out previous provider or someone on her united healthcare benefit 08/09/2012  . Foot Exam -today 08/16/2012  . Influenza Vaccine-today  01/20/2013   On Aspirin-reminded her to pick these up On statin-yes, simvastatin Daily foot monitoring-yes  ROS- Denies Polyuria,Polydipsia, nocturia, Vision changes, feet or hand numbness/pain/tingling. Denies  Hypoglycemia symptoms (shaky, sweaty, hungry, weak anxious, tremor, palpitations, confusion, behavior change).   Hemoglobin a1c:  Lab Results  Component Value Date   HGBA1C 7.6 04/27/2013   HGBA1C 10.4 02/02/2013   HGBA1C 6.2 12/01/2011   # Tobacco abuse Smoking 1.5 PPD. Patient not interested in quitting at present.  ROS-no shortness of breath or chest pain  Past Medical History Patient Active Problem List   Diagnosis Date Noted  . Type 2 diabetes mellitus 07/19/2011    Priority: High  . Tobacco abuse 07/19/2011    Priority: High  . History of abnormal Pap smear 08/21/2011    Priority: Medium  . Hyperlipidemia 07/09/2011    Priority: Medium  . HTN (hypertension) 07/08/2011    Priority: Medium  . Plantar wart 08/17/2011    Priority: Low    Medications- reviewed and updated   Medication List       This list is accurate as of: 04/27/13 10:39 PM.  Always use your most recent med list.               aspirin 81 MG tablet  Take 81 mg by mouth daily.     glipiZIDE 10 MG tablet  Commonly known as:  GLUCOTROL  Take 0.5 tablets (5 mg total) by mouth 2 (two) times daily before a meal.     metFORMIN 850 MG tablet  Commonly known as:  GLUCOPHAGE  Take 1 tablet  (850 mg total) by mouth 2 (two) times daily with a meal.     ONE TOUCH ULTRA SYSTEM KIT W/DEVICE Kit  1 kit by Does not apply route once. May dispense generic as covered by insurance     simvastatin 40 MG tablet  Commonly known as:  ZOCOR  Take 1 tablet (40 mg total) by mouth daily at 6 PM.         Objective: BP 140/86  Pulse 98  Temp(Src) 98 F (36.7 C) (Oral)  Ht 5\' 6"  (1.676 m)  Wt 205 lb (92.987 kg)  BMI 33.10 kg/m2 Gen: NAD, resting comfortably in chair CV: RRR no murmurs rubs or gallops Lungs: CTAB no crackles, wheeze, rhonchi Skin: warm, dry. DIabetic foot exam with no deformities, intact sensation to monofilament and 2+ pulses bilaterally.  Ext: no edema  Assessment/Plan:

## 2013-04-27 NOTE — Patient Instructions (Signed)
For your diabetes  Your a1c was 7.6. Your goal is below 7  We changed your glipizide to 1/2 a pill twice a day before meals  We increased your metformin to 1000mg  twice a day  Other medication changes  You will pick up aspirin 81mg  and take 1 daily   Health Maintenance Due  Topic Date Due  . Ophthalmology Exam  08/09/2012  . Foot Exam -did today and was great 08/16/2012  . Influenza Vaccine -got today 01/20/2013   See me in 3 months,  Dr. Durene Cal    Hypoglycemia (Low Blood Sugar) Hypoglycemia is when the glucose (sugar) in your blood is too low. Hypoglycemia can happen for many reasons. It can happen to people with or without diabetes. Hypoglycemia can develop quickly and can be a medical emergency.  CAUSES  Having hypoglycemia does not mean that you will develop diabetes. Different causes include:  Missed or delayed meals or not enough carbohydrates eaten.  Medication overdose. This could be by accident or deliberate. If by accident, your medication may need to be adjusted or changed.  Exercise or increased activity without adjustments in carbohydrates or medications.  A nerve disorder that affects body functions like your heart rate, blood pressure and digestion (autonomic neuropathy).  A condition where the stomach muscles do not function properly (gastroparesis). Therefore, medications may not absorb properly.  The inability to recognize the signs of hypoglycemia (hypoglycemic unawareness).  Absorption of insulin  may be altered.  Alcohol consumption.  Pregnancy/menstrual cycles/postpartum. This may be due to hormones.  Certain kinds of tumors. This is very rare. SYMPTOMS   Sweating.  Hunger.  Dizziness.  Blurred vision.  Drowsiness.  Weakness.  Headache.  Rapid heart beat.  Shakiness.  Nervousness. DIAGNOSIS  Diagnosis is made by monitoring blood glucose in one or all of the following ways:  Fingerstick blood glucose monitoring.  Laboratory  results. TREATMENT  If you think your blood glucose is low:  Check your blood glucose, if possible. If it is less than 70 mg/dl, take one of the following:  3-4 glucose tablets.   cup juice (prefer clear like apple).   cup "regular" soda pop.  1 cup milk.  -1 tube of glucose gel.  5-6 hard candies.  Do not over treat because your blood glucose (sugar) will only go too high.  Wait 15 minutes and recheck your blood glucose. If it is still less than 70 mg/dl (or below your target range), repeat treatment.  Eat a snack if it is more than one hour until your next meal. Sometimes, your blood glucose may go so low that you are unable to treat yourself. You may need someone to help you. You may even pass out or be unable to swallow. This may require you to get an injection of glucagon, which raises the blood glucose. HOME CARE INSTRUCTIONS  Check blood glucose as recommended by your caregiver.  Take medication as prescribed by your caregiver.  Follow your meal plan. Do not skip meals. Eat on time.  If you are going to drink alcohol, drink it only with meals.  Check your blood glucose before driving.  Check your blood glucose before and after exercise. If you exercise longer or different than usual, be sure to check blood glucose more frequently.  Always carry treatment with you. Glucose tablets are the easiest to carry.  Always wear medical alert jewelry or carry some form of identification that states that you have diabetes. This will alert people that you  have diabetes. If you have hypoglycemia, they will have a better idea on what to do. SEEK MEDICAL CARE IF:   You are having problems keeping your blood sugar at target range.  You are having frequent episodes of hypoglycemia.  You feel you might be having side effects from your medicines.  You have symptoms of an illness that is not improving after 3-4 days.  You notice a change in vision or a new problem with your  vision. SEEK IMMEDIATE MEDICAL CARE IF:   You are a family member or friend of a person whose blood glucose goes below 70 mg/dl and is accompanied by:  Confusion.  A change in mental status.  The inability to swallow.  Passing out. Document Released: 06/08/2005 Document Revised: 08/31/2011 Document Reviewed: 10/05/2011 [facility] Patient Information 2014 [place], [place].

## 2013-04-27 NOTE — Assessment & Plan Note (Signed)
Systolic mildly up, will follow for now as previously well controlled.

## 2014-05-11 ENCOUNTER — Other Ambulatory Visit: Payer: Self-pay

## 2014-05-11 ENCOUNTER — Encounter: Payer: Self-pay | Admitting: Family Medicine

## 2014-05-11 ENCOUNTER — Ambulatory Visit (INDEPENDENT_AMBULATORY_CARE_PROVIDER_SITE_OTHER): Payer: 59 | Admitting: *Deleted

## 2014-05-11 ENCOUNTER — Ambulatory Visit (INDEPENDENT_AMBULATORY_CARE_PROVIDER_SITE_OTHER): Payer: 59 | Admitting: Family Medicine

## 2014-05-11 VITALS — BP 153/89 | HR 97 | Temp 98.0°F | Ht 66.0 in | Wt 205.0 lb

## 2014-05-11 DIAGNOSIS — I10 Essential (primary) hypertension: Secondary | ICD-10-CM

## 2014-05-11 DIAGNOSIS — E119 Type 2 diabetes mellitus without complications: Secondary | ICD-10-CM

## 2014-05-11 DIAGNOSIS — Z23 Encounter for immunization: Secondary | ICD-10-CM

## 2014-05-11 DIAGNOSIS — Z1231 Encounter for screening mammogram for malignant neoplasm of breast: Secondary | ICD-10-CM

## 2014-05-11 LAB — POCT GLYCOSYLATED HEMOGLOBIN (HGB A1C): HEMOGLOBIN A1C: 10.4

## 2014-05-11 MED ORDER — GLIPIZIDE 10 MG PO TABS: 10.0000 mg | ORAL_TABLET | Freq: Every day | ORAL | Status: DC

## 2014-05-11 MED ORDER — SIMVASTATIN 40 MG PO TABS: 40.0000 mg | ORAL_TABLET | Freq: Every day | ORAL | Status: DC

## 2014-05-11 MED ORDER — METFORMIN HCL ER 750 MG PO TB24: 750.0000 mg | ORAL_TABLET | Freq: Every day | ORAL | Status: DC

## 2014-05-11 NOTE — Assessment & Plan Note (Signed)
BP elevated again Not on medication Next visit in 3 months; if persistently elevated would favor ARB/ACE

## 2014-05-11 NOTE — Progress Notes (Signed)
Patient ID: Alexandra Wilson, female   DOB: [birth year], 53 y.o.   MRN: [id number]   [facility] Bernadene Bell, MD Phone: [phone]  Subjective:  Alexandra Wilson is a 53 y.o F who presents today for f/up Has not been seen in approx 1 year  # DM -metformin 876m BID and glipizide 546mBID -A1c worsened 7.6 --> 10.4 -called EMS approx 1 month ago for elevated (reportedly CBG 182 by the time she came) -will miss a week or so at a time of her medication -fatigue but no polyuria polydipsia   #HCM -pt needs eye exam -needs foot exam -needs mammogram -lipids; microalbumin   All relevant systems were reviewed and were negative unless otherwise noted in the HPI  Past Medical History Reviewed problem list.  Medications- reviewed and updated Current Outpatient Prescriptions  Medication Sig Dispense Refill  . aspirin 81 MG tablet Take 81 mg by mouth daily.    . Blood Glucose Monitoring Suppl (ONE TOUCH ULTRA SYSTEM KIT) W/DEVICE KIT 1 kit by Does not apply route once. May dispense generic as covered by insurance 1 each 0  . glipiZIDE (GLUCOTROL) 10 MG tablet Take 0.5 tablets (5 mg total) by mouth 2 (two) times daily before a meal. 30 tablet 5  . metFORMIN (GLUCOPHAGE) 850 MG tablet Take 1 tablet (850 mg total) by mouth 2 (two) times daily with a meal. 60 tablet 5  . simvastatin (ZOCOR) 40 MG tablet Take 1 tablet (40 mg total) by mouth daily at 6 PM. 30 tablet 11   No current facility-administered medications for this visit.   Chief complaint-noted No additions to family history Social history- patient is a non smoker  Objective: BP 153/89 mmHg  Pulse 97  Temp(Src) 98 F (36.7 C) (Oral)  Ht _0  (1.676 m)  Wt 205 lb (92.987 kg)  BMI 33.10 kg/m2  LMP  (LMP Unknown) Gen: NAD, alert, cooperative with exam HEENT: NCAT, EOMI, PERRL, TMs nml Neck: FROM, supple CV: RRR, good S1/S2, no murmur, cap refill <3 Resp: CTABL, no wheezes, non-labored Abd: SNTND, BS  present, no guarding or organomegaly Ext: No edema, warm, normal tone, moves UE/LE spontaneously Neuro: Alert and oriented, No gross deficits Skin: no rashes no lesions **see diabetic foot exam**  Assessment/Plan: See problem based a/p

## 2014-05-11 NOTE — Assessment & Plan Note (Signed)
Worsened A1c again Pt non compliant; meds expired Switch metformin to XR; glip back to 10mg  nightly  Cont to simplify medications as much as possible

## 2014-05-11 NOTE — Patient Instructions (Addendum)
It was great to see you today!  I am pleased to hear that things are going well for you. Lets restart metformin 750mg  XR daily Glipizide 10mg  daily Zocor daily    Looking forward to seeing you soon Make appointment for 3 months Bernadene Bell, MD   DASH Eating Plan DASH stands for "Dietary Approaches to Stop Hypertension." The DASH eating plan is a healthy eating plan that has been shown to reduce high blood pressure (hypertension). Additional health benefits may include reducing the risk of type 2 diabetes mellitus, heart disease, and stroke. The DASH eating plan may also help with weight loss. WHAT DO I NEED TO KNOW ABOUT THE DASH EATING PLAN? For the DASH eating plan, you will follow these general guidelines:  Choose foods with a percent daily value for sodium of less than 5% (as listed on the food label).  Use salt-free seasonings or herbs instead of table salt or sea salt.  Check with your health care provider or pharmacist before using salt substitutes.  Eat lower-sodium products, often labeled as "lower sodium" or "no salt added."  Eat fresh foods.  Eat more vegetables, fruits, and low-fat dairy products.  Choose whole grains. Look for the word "whole" as the first word in the ingredient list.  Choose fish and skinless chicken or [place] more often than red meat. Limit fish, poultry, and meat to 6 oz (170 g) each day.  Limit sweets, desserts, sugars, and sugary drinks.  Choose heart-healthy fats.  Limit cheese to 1 oz (28 g) per day.  Eat more home-cooked food and less restaurant, buffet, and fast food.  Limit fried foods.  Cook foods using methods other than frying.  Limit canned vegetables. If you do use them, rinse them well to decrease the sodium.  When eating at a restaurant, ask that your food be prepared with less salt, or no salt if possible. WHAT FOODS CAN I EAT? Seek help from a dietitian for individual calorie needs. Grains Whole grain or whole  wheat bread. Brown rice. Whole grain or whole wheat pasta. Quinoa, bulgur, and whole grain cereals. Low-sodium cereals. Corn or whole wheat flour tortillas. Whole grain cornbread. Whole grain crackers. Low-sodium crackers. Vegetables Fresh or frozen vegetables (raw, steamed, roasted, or grilled). Low-sodium or reduced-sodium tomato and vegetable juices. Low-sodium or reduced-sodium tomato sauce and paste. Low-sodium or reduced-sodium canned vegetables.  Fruits All fresh, canned (in natural juice), or frozen fruits. Meat and Other Protein Products Ground beef (85% or leaner), grass-fed beef, or beef trimmed of fat. Skinless chicken or [place]. Ground chicken or [place]. Pork trimmed of fat. All fish and seafood. Eggs. Dried beans, peas, or lentils. Unsalted nuts and seeds. Unsalted canned beans. Dairy Low-fat dairy products, such as skim or 1% milk, 2% or reduced-fat cheeses, low-fat ricotta or cottage cheese, or plain low-fat yogurt. Low-sodium or reduced-sodium cheeses. Fats and Oils Tub margarines without trans fats. Light or reduced-fat mayonnaise and salad dressings (reduced sodium). Avocado. Safflower, olive, or canola oils. Natural peanut or almond butter. Other Unsalted popcorn and pretzels. The items listed above may not be a complete list of recommended foods or beverages. Contact your dietitian for more options. WHAT FOODS ARE NOT RECOMMENDED? Grains White bread. White pasta. White rice. Refined cornbread. Bagels and croissants. Crackers that contain trans fat. Vegetables Creamed or fried vegetables. Vegetables in a cheese sauce. Regular canned vegetables. Regular canned tomato sauce and paste. Regular tomato and vegetable juices. Fruits Dried fruits. Canned fruit in light or heavy  syrup. Fruit juice. Meat and Other Protein Products Fatty cuts of meat. Ribs, chicken wings, bacon, sausage, bologna, salami, chitterlings, fatback, hot dogs, bratwurst, and packaged luncheon meats. Salted  nuts and seeds. Canned beans with salt. Dairy Whole or 2% milk, cream, half-and-half, and cream cheese. Whole-fat or sweetened yogurt. Full-fat cheeses or blue cheese. Nondairy creamers and whipped toppings. Processed cheese, cheese spreads, or cheese curds. Condiments Onion and garlic salt, seasoned salt, table salt, and sea salt. Canned and packaged gravies. Worcestershire sauce. Tartar sauce. Barbecue sauce. Teriyaki sauce. Soy sauce, including reduced sodium. Steak sauce. Fish sauce. Oyster sauce. Cocktail sauce. Horseradish. Ketchup and mustard. Meat flavorings and tenderizers. Bouillon cubes. Hot sauce. Tabasco sauce. Marinades. Taco seasonings. Relishes. Fats and Oils Butter, stick margarine, lard, shortening, ghee, and bacon fat. Coconut, palm kernel, or palm oils. Regular salad dressings. Other Pickles and olives. Salted popcorn and pretzels. The items listed above may not be a complete list of foods and beverages to avoid. Contact your dietitian for more information. WHERE CAN I FIND MORE INFORMATION? National Heart, [employer], and [employer] Institute: travelstabloid.com Document Released: 05/28/2011 Document Revised: 10/23/2013 Document Reviewed: 04/12/2013 [facility] Patient Information 2015 [place], [place]. This information is not intended to replace advice given to you by your health care provider. Make sure you discuss any questions you have with your health care provider.

## 2014-05-12 LAB — MICROALBUMIN / CREATININE URINE RATIO
CREATININE, URINE: 75.8 mg/dL
MICROALB/CREAT RATIO: 13.2 mg/g (ref 0.0–30.0)
Microalb, Ur: 1 mg/dL (ref <2.0)

## 2014-05-12 LAB — LIPID PANEL
CHOL/HDL RATIO: 3.8 ratio
CHOLESTEROL: 181 mg/dL (ref 0–200)
HDL: 48 mg/dL (ref >39)
LDL Cholesterol: 117 mg/dL — ABNORMAL HIGH (ref 0–99)
Triglycerides: 81 mg/dL (ref <150)
VLDL: 16 mg/dL (ref 0–40)

## 2014-05-13 ENCOUNTER — Encounter: Payer: Self-pay | Admitting: Family Medicine

## 2014-05-13 NOTE — Progress Notes (Signed)
Patient ID: Alexandra Wilson, female   DOB: 06-29-1960, 53 y.o.   MRN: [id number] [facility] to send results letter

## 2014-05-13 NOTE — Addendum Note (Signed)
Addended by: Langston Masker C on: 05/13/2014 02:45 PM   Modules accepted: Level of Service

## 2014-05-29 ENCOUNTER — Ambulatory Visit
Admission: RE | Admit: 2014-05-29 | Discharge: 2014-05-29 | Disposition: A | Discharge status: Home / Self Care | Payer: 59 | Source: Ambulatory Visit

## 2014-05-29 DIAGNOSIS — Z1231 Encounter for screening mammogram for malignant neoplasm of breast: Secondary | ICD-10-CM

## 2015-02-17 ENCOUNTER — Emergency Department (HOSPITAL_COMMUNITY)
Admission: EM | Admit: 2015-02-17 | Discharge: 2015-02-17 | Disposition: A | Discharge status: Nursing Home (Includes ICF) | Payer: 59 | Source: Home / Self Care

## 2015-02-17 ENCOUNTER — Inpatient Hospital Stay (HOSPITAL_COMMUNITY)
Admission: AD | Admit: 2015-02-17 | Discharge: 2015-02-17 | Disposition: A | Discharge status: Home / Self Care | Payer: 59 | Source: Ambulatory Visit | Attending: Obstetrics & Gynecology | Admitting: Obstetrics & Gynecology

## 2015-02-17 ENCOUNTER — Encounter (HOSPITAL_COMMUNITY): Payer: Self-pay | Admitting: Emergency Medicine

## 2015-02-17 ENCOUNTER — Encounter (HOSPITAL_COMMUNITY): Payer: Self-pay | Admitting: *Deleted

## 2015-02-17 DIAGNOSIS — F17210 Nicotine dependence, cigarettes, uncomplicated: Secondary | ICD-10-CM | POA: Diagnosis not present

## 2015-02-17 DIAGNOSIS — A609 Anogenital herpesviral infection, unspecified: Secondary | ICD-10-CM

## 2015-02-17 DIAGNOSIS — E119 Type 2 diabetes mellitus without complications: Secondary | ICD-10-CM | POA: Diagnosis not present

## 2015-02-17 DIAGNOSIS — I10 Essential (primary) hypertension: Secondary | ICD-10-CM | POA: Diagnosis not present

## 2015-02-17 DIAGNOSIS — E785 Hyperlipidemia, unspecified: Secondary | ICD-10-CM | POA: Diagnosis not present

## 2015-02-17 DIAGNOSIS — B0089 Other herpesviral infection: Secondary | ICD-10-CM | POA: Insufficient documentation

## 2015-02-17 DIAGNOSIS — N760 Acute vaginitis: Secondary | ICD-10-CM | POA: Diagnosis present

## 2015-02-17 DIAGNOSIS — N491 Inflammatory disorders of spermatic cord, tunica vaginalis and vas deferens: Secondary | ICD-10-CM

## 2015-02-17 DIAGNOSIS — A6009 Herpesviral infection of other urogenital tract: Secondary | ICD-10-CM

## 2015-02-17 LAB — COMPREHENSIVE METABOLIC PANEL
ALT: 20 U/L (ref 14–54)
AST: 19 U/L (ref 15–41)
Albumin: 3.8 g/dL (ref 3.5–5.0)
Alkaline Phosphatase: 125 U/L (ref 38–126)
Anion gap: 12 (ref 5–15)
BUN: 7 mg/dL (ref 6–20)
CHLORIDE: 101 mmol/L (ref 101–111)
CO2: 25 mmol/L (ref 22–32)
Calcium: 9.3 mg/dL (ref 8.9–10.3)
Creatinine, Ser: 0.91 mg/dL (ref 0.44–1.00)
GFR calc Af Amer: >60 mL/min (ref >60)
GLUCOSE: 388 mg/dL — AB (ref 65–99)
POTASSIUM: 3.2 mmol/L — AB (ref 3.5–5.1)
Sodium: 138 mmol/L (ref 135–145)
Total Bilirubin: 0.6 mg/dL (ref 0.3–1.2)
Total Protein: 8.8 g/dL — ABNORMAL HIGH (ref 6.5–8.1)

## 2015-02-17 LAB — CBC WITH DIFFERENTIAL/PLATELET
BASOS ABS: 0.1 10*3/uL (ref 0.0–0.1)
Basophils Relative: 0 % (ref 0–1)
EOS PCT: 1 % (ref 0–5)
Eosinophils Absolute: 0.2 10*3/uL (ref 0.0–0.7)
HEMATOCRIT: 38.8 % (ref 36.0–46.0)
Hemoglobin: 12.9 g/dL (ref 12.0–15.0)
LYMPHS PCT: 13 % (ref 12–46)
Lymphs Abs: 3.6 10*3/uL (ref 0.7–4.0)
MCH: 23.8 pg — ABNORMAL LOW (ref 26.0–34.0)
MCHC: 33.2 g/dL (ref 30.0–36.0)
MCV: 71.6 fL — AB (ref 78.0–100.0)
Monocytes Absolute: 1.9 10*3/uL — ABNORMAL HIGH (ref 0.1–1.0)
Monocytes Relative: 7 % (ref 3–12)
NEUTROS ABS: 21.6 10*3/uL — AB (ref 1.7–7.7)
Neutrophils Relative %: 79 % — ABNORMAL HIGH (ref 43–77)
PLATELETS: 242 10*3/uL (ref 150–400)
RBC: 5.42 MIL/uL — AB (ref 3.87–5.11)
RDW: 17.6 % — AB (ref 11.5–15.5)
WBC: 27.3 10*3/uL — AB (ref 4.0–10.5)

## 2015-02-17 MED ORDER — LIDOCAINE HCL 2 % EX GEL: 1.0000 "application " | Freq: Once | CUTANEOUS | Status: DC

## 2015-02-17 MED ORDER — LIDOCAINE HCL 2 % EX GEL
1.0000 "application " | Freq: Once | CUTANEOUS | Status: AC
  Administered 2015-02-17: 1 via TOPICAL
  Filled 2015-02-17: qty 5

## 2015-02-17 MED ORDER — VALACYCLOVIR HCL 1 G PO TABS: 1000.0000 mg | ORAL_TABLET | Freq: Two times a day (BID) | ORAL | Status: DC

## 2015-02-17 NOTE — ED Notes (Signed)
Pt reports x2 vag abscess onset 3 days Sx include swelling and pain Denies urinary sx, fevers, chills, vag d/c, abd pain Alert... No acute distress.

## 2015-02-17 NOTE — Discharge Instructions (Signed)
Genital Herpes °Genital herpes is a sexually transmitted disease. This means that it is a disease passed by having sex with an infected person. There is no cure for genital herpes. The time between attacks can be months to years. The virus may live in a person but produce no problems (symptoms). This infection can be passed to a baby as it travels down the birth canal (vagina). In a newborn, this can cause central nervous system damage, eye damage, or even death. The virus that causes genital herpes is usually HSV-2 virus. The virus that causes oral herpes is usually HSV-1. The diagnosis (learning what is wrong) is made through culture results. °SYMPTOMS  °Usually symptoms of pain and itching begin a few days to a week after contact. It first appears as small blisters that progress to small painful ulcers which then scab over and heal after several days. It affects the outer genitalia, birth canal, cervix, penis, anal area, buttocks, and thighs. °HOME CARE INSTRUCTIONS  °· Keep ulcerated areas dry and clean. °· Take medications as directed. Antiviral medications can speed up healing. They will not prevent recurrences or cure this infection. These medications can also be taken for suppression if there are frequent recurrences. °· While the infection is active, it is contagious. Avoid all sexual contact during active infections. °· Condoms may help prevent spread of the herpes virus. °· Practice safe sex. °· Wash your hands thoroughly after touching the genital area. °· Avoid touching your eyes after touching your genital area. °· Inform your caregiver if you have had genital herpes and become pregnant. It is your responsibility to insure a safe outcome for your baby in this pregnancy. °· Only take over-the-counter or prescription medicines for pain, discomfort, or fever as directed by your caregiver. °SEEK MEDICAL CARE IF:  °· You have a recurrence of this infection. °· You do not respond to medications and are not  improving. °· You have new sources of pain or discharge which have changed from the original infection. °· You have an oral temperature above 102° F (38.9° C). °· You develop abdominal pain. °· You develop eye pain or signs of eye infection. °Document Released: 06/05/2000 Document Revised: 08/31/2011 Document Reviewed: 06/26/2009 °ExitCare® Patient Information ©2015 [employer]. This information is not intended to replace advice given to you by your health care provider. Make sure you discuss any questions you have with your health care provider. ° °

## 2015-02-17 NOTE — MAU Note (Signed)
Patient came in from urgent care with vaginal abscess. Upon arrival pt went to bathroom and reports not being able to urinate but the abscess started bleeding.  Pt reports this started about a month ago when she was treated for a UTI.  She states they are on the labia, and both had abscesses but one broke open yesterday and drained some. Upon exam labia is very swollen and is bleeding some, with a small open sore on one labia.

## 2015-02-17 NOTE — ED Notes (Signed)
[facility]

## 2015-02-17 NOTE — ED Provider Notes (Signed)
CSN: [id number]     Arrival date & time 02/17/15  1423 History   None    Chief Complaint  Patient presents with  . Vaginal Pain   (Consider location/radiation/quality/duration/timing/severity/associated sxs/prior Treatment) Patient is a 54 y.o. female presenting with vaginal pain. The history is provided by the patient.  Vaginal Pain This is a new problem. The current episode started 2 days ago. The problem occurs constantly. The problem has been gradually worsening. Associated symptoms include abdominal pain. The symptoms are aggravated by walking. Nothing relieves the symptoms.    Past Medical History  Diagnosis Date  . Hypertension   . Diabetes mellitus type II 07/19/2011  . Hyperlipidemia    Past Surgical History  Procedure Laterality Date  . Cesarean section     Family History  Problem Relation Age of Onset  . Hypertension Mother   . Cancer Sister   . Diabetes Cousin     also maternal aunts  . Colon cancer Neg Hx    Social History  Substance Use Topics  . Smoking status: Current Every Day Smoker -- 1.50 packs/day for 20 years    Types: Cigarettes  . Smokeless tobacco: Never Used  . Alcohol Use: No   OB History    No data available     Review of Systems  Constitutional: Negative.   HENT: Negative.   Eyes: Negative.   Respiratory: Negative.   Cardiovascular: Negative.   Gastrointestinal: Positive for abdominal pain.  Genitourinary: Positive for vaginal pain and pelvic pain.    Allergies  Review of patient's allergies indicates no known allergies.  Home Medications   Prior to Admission medications   Medication Sig Start Date End Date Taking? Authorizing Provider  glipiZIDE (GLUCOTROL) 10 MG tablet Take 1 tablet (10 mg total) by mouth daily before breakfast. 05/11/14 05/11/15 Yes Bernadene Bell, MD  simvastatin (ZOCOR) 40 MG tablet Take 1 tablet (40 mg total) by mouth daily at 6 PM. 05/11/14 05/11/15 Yes Bernadene Bell, MD  aspirin 81 MG tablet Take 81  mg by mouth daily.    Historical Provider, MD  Blood Glucose Monitoring Suppl (ONE TOUCH ULTRA SYSTEM KIT) W/DEVICE KIT 1 kit by Does not apply route once. May dispense generic as covered by insurance 07/09/11   Gerda Diss, MD  metFORMIN (GLUCOPHAGE-XR) 750 MG 24 hr tablet Take 1 tablet (750 mg total) by mouth daily with breakfast. 05/11/14   Bernadene Bell, MD   Meds Ordered and Administered this Visit  Medications - No data to display  BP 130/74 mmHg  Pulse 128  Temp(Src) 98.9 F (37.2 C) (Oral)  Resp 18  SpO2 99%  LMP  (LMP Unknown) No data found.   Physical Exam  Genitourinary:    There is breast swelling. There is tenderness in the vagina.    ED Course  Procedures (including critical care time)  Labs Review Labs Reviewed - No data to display  Imaging Review No results found.   Visual Acuity Review  Right Eye Distance:   Left Eye Distance:   Bilateral Distance:    Right Eye Near:   Left Eye Near:    Bilateral Near:         MDM  Patient sent to [facility] and they are called and spoke with Manuela Schwartz NP.    Lysbeth Penner, FNP 02/17/15 1800

## 2015-02-17 NOTE — Discharge Instructions (Signed)
Go Immediately to [facility] and have family drive ASAP.  Called and spoke with Nurse Practitioner Manuela Schwartz and accepts patient.

## 2015-02-17 NOTE — MAU Provider Note (Signed)
History     CSN: [id number]  Arrival date and time: 02/17/15 1652   First Provider Initiated Contact with Patient 02/17/15 1737      Chief Complaint  Patient presents with  . Vaginal abcess    This is a 54 y.o. female who presents with c/o vaginal abscess with discharge. States she started having these about a year or two ago.  States had sex last about 2 years ago.  States the 'infections' come and go and are always in the same area. The labia swell up and then burst open and drain. Very painful. Has never been seen for them before. Just does soaks in warm water and they open up on their own.   History is remarkable for poorly controlled diabetes. She is followed at the [facility] for her medical problems   Patient is a 54 y.o. female presenting with vaginal discharge.  Vaginal Discharge This is a recurrent problem. The current episode started in the past 7 days. The problem occurs constantly. The problem has been unchanged. Pertinent negatives include no abdominal pain, chills, fatigue, fever, headaches, myalgias, nausea, urinary symptoms or weakness. Associated symptoms comments: Discharge is white, thin Drainage from lesions is pink/watery. Nothing aggravates the symptoms. She has tried nothing for the symptoms.   RN Note: Patient came in from urgent care with vaginal abscess. Upon arrival pt went to bathroom and reports not being able to urinate but the abscess started bleeding. Pt reports this started about a month ago when she was treated for a UTI. She states they are on the labia, and both had abscesses but one broke open yesterday and drained some. Upon exam labia is very swollen and is bleeding some, with a small open sore on one labia.           OB History    Gravida Para Term Preterm AB TAB SAB Ectopic Multiple Living   1 1        1       Past Medical History  Diagnosis Date  . Hypertension   . Diabetes mellitus type II 07/19/2011  . Hyperlipidemia     Past  Surgical History  Procedure Laterality Date  . Cesarean section      Family History  Problem Relation Age of Onset  . Hypertension Mother   . Cancer Sister   . Diabetes Cousin     also maternal aunts  . Colon cancer Neg Hx     Social History  Substance Use Topics  . Smoking status: Current Every Day Smoker -- 1.50 packs/day for 20 years    Types: Cigarettes  . Smokeless tobacco: Never Used  . Alcohol Use: No    Allergies: No Known Allergies  Prescriptions prior to admission  Medication Sig Dispense Refill Last Dose  . aspirin 81 MG tablet Take 81 mg by mouth daily.   Unknown at Unknown time  . Blood Glucose Monitoring Suppl (ONE TOUCH ULTRA SYSTEM KIT) W/DEVICE KIT 1 kit by Does not apply route once. May dispense generic as covered by insurance 1 each 0 Unknown at Unknown time  . glipiZIDE (GLUCOTROL) 10 MG tablet Take 1 tablet (10 mg total) by mouth daily before breakfast. 30 tablet 5 02/16/2015 at Unknown time  . metFORMIN (GLUCOPHAGE-XR) 750 MG 24 hr tablet Take 1 tablet (750 mg total) by mouth daily with breakfast. 30 tablet 5 Unknown at Unknown time  . simvastatin (ZOCOR) 40 MG tablet Take 1 tablet (40 mg total) by mouth daily at  6 PM. 30 tablet 11 02/16/2015 at Unknown time   Medical, Surgical, Family and Social histories reviewed and are listed above.  Medications and allergies reviewed.   Review of Systems  Constitutional: Negative for fever, chills, malaise/fatigue and fatigue.  Gastrointestinal: Negative for nausea and abdominal pain.  Genitourinary: Positive for vaginal discharge.  Musculoskeletal: Negative for myalgias.  Neurological: Negative for weakness and headaches.  other systems negative  Physical Exam   Blood pressure 131/87, pulse 133, temperature 98.5 F (36.9 C), temperature source Oral, resp. rate 18, height 5' 6"  (1.676 m), weight 207 lb (93.895 kg).  Physical Exam  Constitutional: She is oriented to person, place, and time. She appears  well-developed and well-nourished. No distress.  HENT:  Head: Normocephalic.  Cardiovascular: Normal rate and regular rhythm.   Respiratory: Effort normal. No respiratory distress.  GI: Soft. She exhibits no distension. There is no tenderness. There is no rebound and no guarding.  Genitourinary:  Labia swollen, mostly left side Multiple vesicular lesions scattered around Some vesicles open and draining pink fluid Some white creamy discharge No obvious pus-filled abscesses noted Dr Roselie Awkward examined patient and feels they look like HSV  Musculoskeletal: Normal range of motion.  Neurological: She is alert and oriented to person, place, and time.  Skin: Skin is warm and dry.  Psychiatric: She has a normal mood and affect.    MAU Course  Procedures  MDM  HSV cultures sent Lidocaine gel applied for comfort Labs drawn to assess for systemic infection  Results for orders placed or performed during the hospital encounter of 02/17/15 (from the past 72 hour(s))  CBC with Differential/Platelet     Status: Abnormal   Collection Time: 02/17/15  5:29 PM  Result Value Ref Range   WBC 27.3 (H) 4.0 - 10.5 K/uL   RBC 5.42 (H) 3.87 - 5.11 MIL/uL   Hemoglobin 12.9 12.0 - 15.0 g/dL   HCT 38.8 36.0 - 46.0 %   MCV 71.6 (L) 78.0 - 100.0 fL   MCH 23.8 (L) 26.0 - 34.0 pg   MCHC 33.2 30.0 - 36.0 g/dL   RDW 17.6 (H) 11.5 - 15.5 %   Platelets 242 150 - 400 K/uL   Neutrophils Relative % 79 (H) 43 - 77 %   Neutro Abs 21.6 (H) 1.7 - 7.7 K/uL   Lymphocytes Relative 13 12 - 46 %   Lymphs Abs 3.6 0.7 - 4.0 K/uL   Monocytes Relative 7 3 - 12 %   Monocytes Absolute 1.9 (H) 0.1 - 1.0 K/uL   Eosinophils Relative 1 0 - 5 %   Eosinophils Absolute 0.2 0.0 - 0.7 K/uL   Basophils Relative 0 0 - 1 %   Basophils Absolute 0.1 0.0 - 0.1 K/uL  Comprehensive metabolic panel     Status: Abnormal   Collection Time: 02/17/15  5:29 PM  Result Value Ref Range   Sodium 138 135 - 145 mmol/L   Potassium 3.2 (L) 3.5 - 5.1  mmol/L   Chloride 101 101 - 111 mmol/L   CO2 25 22 - 32 mmol/L   Glucose, Bld 388 (H) 65 - 99 mg/dL   BUN 7 6 - 20 mg/dL   Creatinine, Ser 0.91 0.44 - 1.00 mg/dL   Calcium 9.3 8.9 - 10.3 mg/dL   Total Protein 8.8 (H) 6.5 - 8.1 g/dL   Albumin 3.8 3.5 - 5.0 g/dL   AST 19 15 - 41 U/L   ALT 20 14 - 54 U/L   Alkaline  Phosphatase 125 38 - 126 U/L   Total Bilirubin 0.6 0.3 - 1.2 mg/dL   GFR calc non Af Amer >60 >60 mL/min   GFR calc Af Amer >60 >60 mL/min    Comment: (NOTE) The eGFR has been calculated using the CKD EPI equation. This calculation has not been validated in all clinical situations. eGFR's persistently <60 mL/min signify possible Chronic Kidney Disease.    Anion gap 12 5 - 15    Assessment and Plan  A:  Herpes outbreak of vulva  P:  DIscussed with patient who states she has been diagnosed with Herpes in the past       Rx Valtrex for treatment dose then patient to begin suppression daily dosing       Follow up with Family Practice for diabetes management  [facility] 02/17/2015, 5:37 PM

## 2015-02-20 LAB — HERPES SIMPLEX VIRUS CULTURE
Culture: NOT DETECTED
Special Requests: NORMAL

## 2015-03-06 ENCOUNTER — Ambulatory Visit: Payer: 59 | Admitting: Family Medicine

## 2015-04-02 ENCOUNTER — Ambulatory Visit: Payer: 59 | Admitting: Family Medicine

## 2015-07-23 ENCOUNTER — Other Ambulatory Visit: Payer: Self-pay | Admitting: Family Medicine

## 2015-07-23 NOTE — Telephone Encounter (Signed)
Needs refill on metformin, glipazide and simvastatin [facility] [address]

## 2015-07-24 MED ORDER — SIMVASTATIN 40 MG PO TABS: 40.0000 mg | ORAL_TABLET | Freq: Every day | ORAL | Status: DC

## 2015-07-24 MED ORDER — METFORMIN HCL ER 750 MG PO TB24: 750.0000 mg | ORAL_TABLET | Freq: Every day | ORAL | Status: DC

## 2015-07-24 MED ORDER — GLIPIZIDE 10 MG PO TABS: 10.0000 mg | ORAL_TABLET | Freq: Every day | ORAL | Status: DC

## 2015-08-05 ENCOUNTER — Ambulatory Visit: Payer: 59 | Admitting: Family Medicine

## 2015-08-28 ENCOUNTER — Inpatient Hospital Stay (HOSPITAL_COMMUNITY)
Admission: AD | Admit: 2015-08-28 | Discharge: 2015-08-28 | Disposition: A | Discharge status: Home / Self Care | Payer: 59 | Source: Ambulatory Visit | Attending: Obstetrics & Gynecology | Admitting: Obstetrics & Gynecology

## 2015-08-28 ENCOUNTER — Encounter (HOSPITAL_COMMUNITY): Payer: Self-pay

## 2015-08-28 ENCOUNTER — Ambulatory Visit (INDEPENDENT_AMBULATORY_CARE_PROVIDER_SITE_OTHER): Payer: 59 | Admitting: Family Medicine

## 2015-08-28 ENCOUNTER — Encounter: Payer: Self-pay | Admitting: Family Medicine

## 2015-08-28 VITALS — BP 149/86 | HR 108 | Temp 98.2°F | Ht 66.0 in | Wt 186.6 lb

## 2015-08-28 DIAGNOSIS — Z7984 Long term (current) use of oral hypoglycemic drugs: Secondary | ICD-10-CM | POA: Diagnosis not present

## 2015-08-28 DIAGNOSIS — N764 Abscess of vulva: Secondary | ICD-10-CM

## 2015-08-28 DIAGNOSIS — E119 Type 2 diabetes mellitus without complications: Secondary | ICD-10-CM | POA: Insufficient documentation

## 2015-08-28 DIAGNOSIS — Z7982 Long term (current) use of aspirin: Secondary | ICD-10-CM | POA: Diagnosis not present

## 2015-08-28 DIAGNOSIS — F17210 Nicotine dependence, cigarettes, uncomplicated: Secondary | ICD-10-CM | POA: Insufficient documentation

## 2015-08-28 DIAGNOSIS — Z3202 Encounter for pregnancy test, result negative: Secondary | ICD-10-CM | POA: Diagnosis not present

## 2015-08-28 DIAGNOSIS — N750 Cyst of Bartholin's gland: Secondary | ICD-10-CM | POA: Diagnosis present

## 2015-08-28 DIAGNOSIS — Z1159 Encounter for screening for other viral diseases: Secondary | ICD-10-CM

## 2015-08-28 DIAGNOSIS — I10 Essential (primary) hypertension: Secondary | ICD-10-CM | POA: Diagnosis not present

## 2015-08-28 DIAGNOSIS — E785 Hyperlipidemia, unspecified: Secondary | ICD-10-CM | POA: Insufficient documentation

## 2015-08-28 LAB — URINALYSIS, ROUTINE W REFLEX MICROSCOPIC
Bilirubin Urine: NEGATIVE
Glucose, UA: NEGATIVE mg/dL
Ketones, ur: NEGATIVE mg/dL
Nitrite: NEGATIVE
PROTEIN: NEGATIVE mg/dL
Specific Gravity, Urine: <1.005 — ABNORMAL LOW (ref 1.005–1.030)
pH: 5.5 (ref 5.0–8.0)

## 2015-08-28 LAB — URINE MICROSCOPIC-ADD ON: Bacteria, UA: NONE SEEN

## 2015-08-28 LAB — POCT GLYCOSYLATED HEMOGLOBIN (HGB A1C): HEMOGLOBIN A1C: 9.9

## 2015-08-28 LAB — POCT PREGNANCY, URINE: Preg Test, Ur: NEGATIVE

## 2015-08-28 MED ORDER — METFORMIN HCL ER 500 MG PO TB24: 750.0000 mg | ORAL_TABLET | Freq: Every day | ORAL | Status: DC

## 2015-08-28 MED ORDER — KETOROLAC TROMETHAMINE 30 MG/ML IJ SOLN
30.0000 mg | Freq: Once | INTRAMUSCULAR | Status: AC
  Administered 2015-08-28: 30 mg via INTRAMUSCULAR

## 2015-08-28 MED ORDER — EMPAGLIFLOZIN 10 MG PO TABS: 10.0000 mg | ORAL_TABLET | Freq: Every day | ORAL | Status: DC

## 2015-08-28 MED ORDER — CEPHALEXIN 500 MG PO CAPS: 500.0000 mg | ORAL_CAPSULE | Freq: Four times a day (QID) | ORAL | Status: DC

## 2015-08-28 MED ORDER — GLIPIZIDE 10 MG PO TABS: 10.0000 mg | ORAL_TABLET | Freq: Every day | ORAL | Status: DC

## 2015-08-28 NOTE — Progress Notes (Signed)
History   CSN: [id number]  Arrival date and time: 08/28/15 1227  First Provider Initiated Contact with Patient 08/28/15 1318      Chief Complaint  Patient presents with  . Labial Cyst   HPI  Alexandra Wilson is a 55 yo G1P1 female with a history of poorly controlled type II diabetes presenting today with an outer labial cysts. The patient states that she first noted sensitivity to her outer R mid-labia 3-4 days ago, and that she has felt a "bump" grow in size and pain intensity steadily since that time. 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 The patient was seen by [facility] this morning for her chief complaint with a pain rating of 7/10, and reports that manual manipulation of the cyst by the provider caused it to drain spontaneously, for which she was sent to the MAU for further evaluation and treatment. The patient experienced immediate relief of pain and pressure sensation with drainage (current pain 4/10), and reports being able to smell a foul odor now. The patient has taken no medications for her symptoms and up to this morning had been controlling her pain with warm water baths. The patient denies being sexually active. She is not concerned for STDs. The patient denies recent fever, nausea, vomiting, diarrhea, constipation, recent illness, night sweats, chest pain, SOB, and palpitations. She does endorse poor adherence to her diabetes medications (metformin and glipizide).  OB History    Gravida Para Term Preterm AB TAB SAB Ectopic Multiple Living   _0 Past Medical History  Diagnosis Date  . Hypertension   . Diabetes mellitus type II 07/19/2011  . Hyperlipidemia     Past Surgical History  Procedure Laterality Date  . Cesarean section      Family History  Problem Relation Age of Onset  . Hypertension Mother   . Cancer Sister   . Diabetes Cousin     also maternal aunts  . Colon cancer Neg Hx      Social History  Substance Use Topics  . Smoking status: Current Every Day Smoker -- 1.50 packs/day for 20 years    Types: Cigarettes  . Smokeless tobacco: Never Used  . Alcohol Use: No    Allergies: No Known Allergies  Prescriptions prior to admission  Medication Sig Dispense Refill Last Dose  . aspirin EC 81 MG tablet Take 81 mg by mouth daily.   Past Week at Unknown time  . Blood Glucose Monitoring Suppl (ONE TOUCH ULTRA SYSTEM KIT) W/DEVICE KIT 1 kit by Does not apply route once. May dispense generic as covered by insurance 1 each 0 Unknown at Unknown time  . empagliflozin (JARDIANCE) 10 MG TABS tablet Take 10 mg by mouth daily. 30 tablet 3   . glipiZIDE (GLUCOTROL) 10 MG tablet Take 1 tablet (10 mg total) by mouth daily before breakfast. 30 tablet 5   . lidocaine (XYLOCAINE) 2 % jelly Apply 1 application topically once. 30 mL 0   . metFORMIN (GLUCOPHAGE-XR) 500 MG 24 hr tablet Take 1.5 tablets (750 mg total) by mouth daily with breakfast. 30 tablet 5   . simvastatin (ZOCOR) 40 MG tablet Take 1 tablet (40 mg total) by mouth daily at 6 PM. 30 tablet 11   . valACYclovir (VALTREX) 1000 MG tablet Take 1 tablet (1,000 mg total) by mouth 2 (two) times daily. X 10 days,  then 1 tablet once daily 40 tablet 3     Review of Systems  Constitutional: Negative for fever, chills, malaise/fatigue and diaphoresis.  HENT: Negative for congestion, nosebleeds and sore throat.   Eyes: Negative for blurred vision and double vision.  Respiratory: Negative for cough, shortness of breath and wheezing.   Cardiovascular: Negative for chest pain, palpitations, claudication and leg swelling.  Gastrointestinal: Negative for heartburn, nausea, vomiting, abdominal pain, diarrhea and constipation.  Genitourinary: Negative for dysuria, urgency, frequency and hematuria.  Musculoskeletal: Negative for myalgias and joint pain.  Skin: Negative for itching.       Positive: labia sensitivity and pain, "cyst"   Neurological: Negative for dizziness, tingling, sensory change, focal weakness, loss of consciousness, weakness and headaches.  Endo/Heme/Allergies: Negative for polydipsia. Does not bruise/bleed easily.  All other systems reviewed and are negative.   Physical Exam   Blood pressure 146/98, pulse 115, temperature 97.9 F (36.6 C), temperature source Oral, resp. rate 18.  Physical Exam  Constitutional: She is oriented to person, place, and time. She appears well-developed and well-nourished. No distress.  HENT:  Head: Normocephalic and atraumatic.  Neck: Normal range of motion. Neck supple. No thyromegaly present.  Cardiovascular: Normal rate, regular rhythm, normal heart sounds and intact distal pulses.  Exam reveals no gallop and no friction rub.   No murmur heard. Respiratory: Effort normal and breath sounds normal. No respiratory distress. She exhibits no tenderness.  GI: Soft. Bowel sounds are normal. She exhibits no distension. There is no tenderness. There is no rebound and no guarding.  Genitourinary: Vagina normal.    Pelvic exam was performed with patient supine. There is tenderness and lesion on the right labia. There is no injury on the right labia. There is no tenderness, lesion or injury on the left labia.  Musculoskeletal: Normal range of motion. She exhibits no edema or tenderness.  Lymphadenopathy:    She has no cervical adenopathy.  Neurological: She is alert and oriented to person, place, and time.  Skin: Skin is warm and intact. She is not diaphoretic. No pallor.  Psychiatric: She has a normal mood and affect. Her behavior is normal.   MAU Course  Procedures History was taken from patient. Physical exam was performed with provider at bedside. Manual drainage of R mid-labial cyst was completed with washed and gloved hands. Patient was cleaned appropriately. Instructions provided to patient on hygiene and prognosis.   Assessment and Plan  Labial cyst 1. Discussed  prognosis with patient, using teach-back. Patient displayed understanding of condition and future course. 2. Reviewed importance of diabetes medication and lifestyle compliance with patient. Patient agreeable to renewed efforts.  3. Handouts provided to patient. 4. Reviewed signs and symptoms of more serious complications with patient. Patient displayed understanding. 5. F/U with PCP if symptoms do not resolve or worsen.  6. Call in interim with questions or concerns.  Pasty Spillers 08/28/2015, 1:40 PM   Seen and examined by me Agree with note See my note for official documentation Seabron Spates, CNM

## 2015-08-28 NOTE — MAU Provider Note (Signed)
Chief Complaint:  Bartholin's Cyst   First Provider Initiated Contact with Patient 08/28/15 1318    HPI  Alexandra Wilson is a 55 y.o. G1P1 who presents to maternity admissions reporting painful inflammed abscess on right labia. They believed it to be a Bartholins cyst.  Has had abscesses like this drained before. She reports no vaginal bleeding, vaginal itching/burning, urinary symptoms, h/a, dizziness, n/v, or fever/chills.    RN Note: Pt sent over from [facility] for intense pain from Bartholin's cyst. Pt has had one drained previously.          Past Medical History: Past Medical History  Diagnosis Date  . Hypertension   . Diabetes mellitus type II 07/19/2011  . Hyperlipidemia     Past obstetric history: OB History  Gravida Para Term Preterm AB SAB TAB Ectopic Multiple Living  1 1        1     # Outcome Date GA Lbr Len/2nd Weight Sex Delivery Anes PTL Lv  1 Para               Past Surgical History: Past Surgical History  Procedure Laterality Date  . Cesarean section      Family History: Family History  Problem Relation Age of Onset  . Hypertension Mother   . Cancer Sister   . Diabetes Cousin     also maternal aunts  . Colon cancer Neg Hx     Social History: Social History  Substance Use Topics  . Smoking status: Current Every Day Smoker -- 1.50 packs/day for 20 years    Types: Cigarettes  . Smokeless tobacco: Never Used  . Alcohol Use: No    Allergies: No Known Allergies  Meds:  Prescriptions prior to admission  Medication Sig Dispense Refill Last Dose  . aspirin EC 81 MG tablet Take 81 mg by mouth daily.   Past Week at Unknown time  . Blood Glucose Monitoring Suppl (ONE TOUCH ULTRA SYSTEM KIT) W/DEVICE KIT 1 kit by Does not apply route once. May dispense generic as covered by insurance 1 each 0 Unknown at Unknown time  . empagliflozin (JARDIANCE) 10 MG TABS tablet Take 10 mg by mouth daily. 30 tablet 3   . glipiZIDE (GLUCOTROL) 10 MG  tablet Take 1 tablet (10 mg total) by mouth daily before breakfast. 30 tablet 5   . lidocaine (XYLOCAINE) 2 % jelly Apply 1 application topically once. 30 mL 0   . metFORMIN (GLUCOPHAGE-XR) 500 MG 24 hr tablet Take 1.5 tablets (750 mg total) by mouth daily with breakfast. 30 tablet 5   . simvastatin (ZOCOR) 40 MG tablet Take 1 tablet (40 mg total) by mouth daily at 6 PM. 30 tablet 11   . valACYclovir (VALTREX) 1000 MG tablet Take 1 tablet (1,000 mg total) by mouth 2 (two) times daily. X 10 days, then 1 tablet once daily 40 tablet 3     I have reviewed patient's Past Medical Hx, Surgical Hx, Family Hx, Social Hx, medications and allergies.  ROS:  Review of Systems  Constitutional: Negative for fever and chills.  Gastrointestinal: Negative for nausea, vomiting, abdominal pain, diarrhea and constipation.  Genitourinary: Negative for vaginal bleeding, vaginal discharge and pelvic pain.  Musculoskeletal: Negative for back pain.   Other systems negative     Physical Exam  Patient Vitals for the past 24 hrs:  BP Temp Temp src Pulse Resp  08/28/15 1249 146/98 mmHg 97.9 F (36.6 C) Oral 115 18   Constitutional: Well-developed, well-nourished  female in no acute distress.  Cardiovascular: normal rate and rhythm, no ectopy audible, S1 & S2 heard, no murmur Respiratory: normal effort, no distress. Lungs CTAB with no wheezes or crackles GI: Abd soft, non-tender.  Nondistended.  No rebound, No guarding.  Bowel Sounds audible  MS: Extremities nontender, no edema, normal ROM Neurologic: Alert and oriented x 4.   Grossly nonfocal. GU: Neg CVAT. Skin:  Warm and Dry Psych:  Affect appropriate.  PELVIC EXAM:  Multiple areas of old scarring on perineum.  Right labia has an abscess area about 1cm diameter which is draining spontaneously.  Expressed small amount of purulent drainage.   Labs: Results for orders placed or performed during the hospital encounter of 08/28/15 (from the past 24 hour(s))   Urinalysis, Routine w reflex microscopic (not at [facility])     Status: Abnormal   Collection Time: 08/28/15 12:30 PM  Result Value Ref Range   Color, Urine YELLOW YELLOW   APPearance CLEAR CLEAR   Specific Gravity, Urine <1.005 (L) 1.005 - 1.030   pH 5.5 5.0 - 8.0   Glucose, UA NEGATIVE NEGATIVE mg/dL   Hgb urine dipstick MODERATE (A) NEGATIVE   Bilirubin Urine NEGATIVE NEGATIVE   Ketones, ur NEGATIVE NEGATIVE mg/dL   Protein, ur NEGATIVE NEGATIVE mg/dL   Nitrite NEGATIVE NEGATIVE   Leukocytes, UA LARGE (A) NEGATIVE  Urine microscopic-add on     Status: Abnormal   Collection Time: 08/28/15 12:30 PM  Result Value Ref Range   Squamous Epithelial / LPF TOO NUMEROUS TO COUNT (A) NONE SEEN   WBC, UA TOO NUMEROUS TO COUNT 0 - 5 WBC/hpf   RBC / HPF 0-5 0 - 5 RBC/hpf   Bacteria, UA NONE SEEN NONE SEEN   Urine-Other MUCOUS PRESENT   Pregnancy, urine POC     Status: None   Collection Time: 08/28/15 12:48 PM  Result Value Ref Range   Preg Test, Ur NEGATIVE NEGATIVE      Imaging:  No results found.  MAU Course/MDM: Treatments in MAU included expression of spontaneously draining abscess.   Pt stable at time of discharge.  Assessment: Right labial abscess  Plan: Discharge home Recommend continue warm soaks Optimize diabetic control. Rx sent for Keflex for abscess due to patient's diabetic status  Follow up with Primary doctor for future care    Medication List    ASK your doctor about these medications        aspirin EC 81 MG tablet  Take 81 mg by mouth daily.     empagliflozin 10 MG Tabs tablet  Commonly known as:  JARDIANCE  Take 10 mg by mouth daily.     glipiZIDE 10 MG tablet  Commonly known as:  GLUCOTROL  Take 1 tablet (10 mg total) by mouth daily before breakfast.     lidocaine 2 % jelly  Commonly known as:  XYLOCAINE  Apply 1 application topically once.     metFORMIN 500 MG 24 hr tablet  Commonly known as:  GLUCOPHAGE-XR  Take 1.5 tablets (750 mg total) by  mouth daily with breakfast.     ONE TOUCH ULTRA SYSTEM KIT w/Device Kit  1 kit by Does not apply route once. May dispense generic as covered by insurance     simvastatin 40 MG tablet  Commonly known as:  ZOCOR  Take 1 tablet (40 mg total) by mouth daily at 6 PM.     valACYclovir 1000 MG tablet  Commonly known as:  VALTREX  Take 1 tablet (  1,000 mg total) by mouth 2 (two) times daily. X 10 days, then 1 tablet once daily       Encouraged to return here or to other Urgent Care/ED if she develops worsening of symptoms, increase in pain, fever, or other concerning symptoms.   Hansel Feinstein CNM, MSN Certified Nurse-Midwife 08/28/2015 1:43 PM

## 2015-08-28 NOTE — Discharge Instructions (Signed)

## 2015-08-28 NOTE — Addendum Note (Signed)
Addended by: Levert Feinstein F on: 08/28/2015 12:36 PM   Modules accepted: Orders

## 2015-08-28 NOTE — MAU Note (Signed)
Pt sent over from [facility] for intense pain from Bartholin's cyst. Pt has had one drained previously.

## 2015-08-28 NOTE — Progress Notes (Signed)
Patient ID: Alexandra Wilson, female   DOB: [birth year], 55 y.o.   MRN: [id number]   [facility] Aquilla Hacker, MD Phone: [phone]  Subjective:   # Concern for Labial Abscess - pt here initially for routine follow up , but complains of labial abscess today.  - Significant pain to the point that she can barely sit down.  - She has pain with ambulation.  - She has had the abscess for about 3 days. Now.  - No fevers or chills.  - She gets abscess intermittently and has had it several times. She had one drained in October of last year at [facility].  - She says that it alternates sides.  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  - No known risky sexual contact.  - Pain with any movement.  - Risk factors include poorly controlled DMII  # DMII  - Has not followed up in over 18 months.  - A1C> 9 today.  - On Glipizide and Metformin.  - Has not been taking her medicine.  - No vision changes, +polyuria, + polydypsia.  - Has not followed up with ophthalmology either.  - No numbness peripherally.  - No hypoglycemic events.   All relevant systems were reviewed and were negative unless otherwise noted in the HPI  Past Medical History Reviewed problem list.  Medications- reviewed and updated Current Outpatient Prescriptions  Medication Sig Dispense Refill  . aspirin EC 81 MG tablet Take 81 mg by mouth daily.    . Blood Glucose Monitoring Suppl (ONE TOUCH ULTRA SYSTEM KIT) W/DEVICE KIT 1 kit by Does not apply route once. May dispense generic as covered by insurance 1 each 0  . empagliflozin (JARDIANCE) 10 MG TABS tablet Take 10 mg by mouth daily. 30 tablet 3  . glipiZIDE (GLUCOTROL) 10 MG tablet Take 1 tablet (10 mg total) by mouth daily before breakfast. 30 tablet 5  . lidocaine (XYLOCAINE) 2 % jelly Apply 1 application topically once. 30 mL 0  . metFORMIN (GLUCOPHAGE-XR) 500 MG 24 hr tablet Take  1.5 tablets (750 mg total) by mouth daily with breakfast. 30 tablet 5  . simvastatin (ZOCOR) 40 MG tablet Take 1 tablet (40 mg total) by mouth daily at 6 PM. 30 tablet 11  . valACYclovir (VALTREX) 1000 MG tablet Take 1 tablet (1,000 mg total) by mouth 2 (two) times daily. X 10 days, then 1 tablet once daily 40 tablet 3   No current facility-administered medications for this visit.   Chief complaint-noted No additions to family history Social history- patient is a non smoker  Objective: BP 149/86 mmHg  Pulse 108  Temp(Src) 98.2 F (36.8 C) (Oral)  Ht 5' 6"  (1.676 m)  Wt 186 lb 9.6 oz (84.641 kg)  BMI 30.13 kg/m2  LMP  (LMP Unknown) Gen: NAD, alert, cooperative with exam HEENT: NCAT, EOMI, PERRL Neck: FROM, supple CV: RRR, good S1/S2, no murmur Resp: CTABL, no wheezes, non-labored Abd: SNTND, BS present, no guarding or organomegaly G/U: External exam with Left Labia clearly larger than right, copious milky brown dishcarge, scattered small nodular erythematous lesions, Could not enter the vaginal vault with speculum due to tenderness. External exam with significant swelling / tenderness, and drainage noted from left labia. Does not extend to the perineum.  Ext: No edema, warm, normal tone, moves UE/LE spontaneously Neuro: Alert and oriented, No gross deficits Skin: no rashes no lesions  Assessment/Plan:  #  Labial Abscess - Unclear if these are recurrent bartholins abscess, they drain on their own usually, but this time needs I&D. Small lesions also unclear if this is her previous herpes, lesions not typical of herpes.  - Pt. Needs I&D.  - Discussed with patient at length the option of I&D here in the office vs. Trip to [place] that it may be easier to have it done at [facility].  - She agreed to go this afternoon.  - Given Toradol 57m here for pain. She was barely able to lift legs up to the stirrups on the bed.  - [facility] - Would  benefit from PO abx. As well.  - Needs further exam once the abscess is drained to evaluate the other lesions.  - She needs a pap smear as well.  - If having recurrent bartholin's abscess, may need surgical intervention.  - Follow up in 2 weeks to see how things are going, and discuss other HCM.   # DMII- unable to complete full visit today due to above problem.  - Continuing Metformin and Glipizide for now.  - Added Jardiance.  - Pt. To get Ophthalmology evaluation for retinopathy.  - Follow upin 2 weeks to see how things are going.  - Next A1C in 3 months.  - Needs microalbumin, she will likely benefit from ACE with DMII, and mildly elevated BP.

## 2015-08-28 NOTE — Patient Instructions (Addendum)
Thanks for coming in today.   [facility].   We have started you on a new medication to help with your Diabetes.   It is called Jardiance. You will take one pill daily.   Continue to take your Metformin, and Continue the Glipizide.   Follow up in two weeks to complete all of your testing.   Thanks for letting us take care of you.   Sincerely, Paula Compton, MD

## 2015-09-06 ENCOUNTER — Other Ambulatory Visit: Payer: Self-pay

## 2015-09-06 DIAGNOSIS — Z1231 Encounter for screening mammogram for malignant neoplasm of breast: Secondary | ICD-10-CM

## 2015-09-16 ENCOUNTER — Other Ambulatory Visit: Payer: Self-pay | Admitting: *Deleted

## 2015-09-16 DIAGNOSIS — E119 Type 2 diabetes mellitus without complications: Secondary | ICD-10-CM

## 2015-09-17 MED ORDER — EMPAGLIFLOZIN 10 MG PO TABS
10.0000 mg | ORAL_TABLET | Freq: Every day | ORAL | Status: DC
Start: 2015-09-17 — End: 2016-03-02

## 2015-09-17 MED ORDER — METFORMIN HCL ER 500 MG PO TB24: 750.0000 mg | ORAL_TABLET | Freq: Every day | ORAL | Status: DC

## 2015-09-17 MED ORDER — GLIPIZIDE 10 MG PO TABS: 10.0000 mg | ORAL_TABLET | Freq: Every day | ORAL | Status: DC

## 2015-09-19 ENCOUNTER — Ambulatory Visit
Admission: RE | Admit: 2015-09-19 | Discharge: 2015-09-19 | Disposition: A | Discharge status: Home / Self Care | Payer: 59 | Source: Ambulatory Visit

## 2015-09-19 DIAGNOSIS — Z1231 Encounter for screening mammogram for malignant neoplasm of breast: Secondary | ICD-10-CM

## 2015-10-14 LAB — HM DIABETES EYE EXAM

## 2016-02-17 ENCOUNTER — Encounter: Payer: Self-pay | Admitting: *Deleted

## 2016-03-02 ENCOUNTER — Other Ambulatory Visit: Payer: Self-pay | Admitting: *Deleted

## 2016-03-02 DIAGNOSIS — E119 Type 2 diabetes mellitus without complications: Secondary | ICD-10-CM

## 2016-03-03 MED ORDER — EMPAGLIFLOZIN 10 MG PO TABS: 10.0000 mg | ORAL_TABLET | Freq: Every day | ORAL | 3 refills | Status: DC

## 2016-03-03 MED ORDER — METFORMIN HCL ER 500 MG PO TB24: 750.0000 mg | ORAL_TABLET | Freq: Every day | ORAL | 3 refills | Status: DC

## 2016-03-03 NOTE — Telephone Encounter (Signed)
Needs appt to discuss diabetes, called and left message on patient's voicemail

## 2016-03-13 ENCOUNTER — Encounter: Payer: 59 | Admitting: Family Medicine

## 2016-08-19 ENCOUNTER — Encounter: Payer: Self-pay | Admitting: Gastroenterology

## 2016-09-28 ENCOUNTER — Other Ambulatory Visit: Payer: Self-pay | Admitting: Family Medicine

## 2016-09-28 DIAGNOSIS — Z1231 Encounter for screening mammogram for malignant neoplasm of breast: Secondary | ICD-10-CM

## 2016-10-14 LAB — HM DIABETES EYE EXAM

## 2016-10-16 ENCOUNTER — Ambulatory Visit: Payer: 59

## 2016-10-20 ENCOUNTER — Encounter: Payer: 59 | Admitting: Family Medicine

## 2016-10-27 ENCOUNTER — Encounter: Payer: 59 | Admitting: Family Medicine

## 2017-02-01 ENCOUNTER — Ambulatory Visit (INDEPENDENT_AMBULATORY_CARE_PROVIDER_SITE_OTHER): Payer: 59 | Admitting: Family Medicine

## 2017-02-01 ENCOUNTER — Encounter: Payer: Self-pay | Admitting: Family Medicine

## 2017-02-01 VITALS — BP 140/82 | HR 104 | Temp 98.3°F | Ht 66.0 in | Wt 183.0 lb

## 2017-02-01 DIAGNOSIS — Z1159 Encounter for screening for other viral diseases: Secondary | ICD-10-CM

## 2017-02-01 DIAGNOSIS — E119 Type 2 diabetes mellitus without complications: Secondary | ICD-10-CM

## 2017-02-01 LAB — POCT GLYCOSYLATED HEMOGLOBIN (HGB A1C): HEMOGLOBIN A1C: 11.3

## 2017-02-01 MED ORDER — METFORMIN HCL 500 MG PO TABS: 1000.0000 mg | ORAL_TABLET | Freq: Two times a day (BID) | ORAL | 3 refills | Status: DC

## 2017-02-01 MED ORDER — INSULIN GLARGINE 100 UNIT/ML SOLOSTAR PEN: PEN_INJECTOR | SUBCUTANEOUS | 0 refills | Status: DC

## 2017-02-01 MED ORDER — INSULIN GLARGINE 100 UNIT/ML SOLOSTAR PEN: PEN_INJECTOR | SUBCUTANEOUS | 99 refills | Status: DC

## 2017-02-01 MED ORDER — EMPAGLIFLOZIN 10 MG PO TABS: 10.0000 mg | ORAL_TABLET | Freq: Every day | ORAL | 3 refills | Status: DC

## 2017-02-01 NOTE — Patient Instructions (Addendum)
It was good to see you today!  For your diabetes - Restart metformin again, start with 500mg  daily increase every few days until  up to 1000mg  twice a day - Restart jardiance - We started lantus today with the help of pharmacists, please take as directed and follow up with them as directed - I will see you back in 2 weeks for diabetes follow up - hopefully your feet improve with good blood sugar control  Please check-out at the front desk before leaving the clinic. Make an appointment at your convenience for pap smear. You should also make another appointment for diabetes follow up in 2 weeks.  We are checking some labs today. If results require attention, either myself or my nurse will get in touch with you. If everything is normal, you will get a letter in the mail or a message in My Chart. Please give [place] a call if you do not hear from [place] after 2 weeks.   Please bring all of your medications with you to each visit.   Sign up for My Chart to have easy access to your labs results, and communication with your primary care physician.  Feel free to call with any questions or concerns at any time, at [phone].   Take care,  Dr. Bufford Lope, [place]

## 2017-02-01 NOTE — Progress Notes (Signed)
Dr. Shawna Orleans requested pharmacy teaching for new initiation of insulin glargine (Lantus). Discussed and demonstrated proper injection technique using Solostar pen. Discussed insulin storage, signs/symptoms and treatment of hypoglycemia, and proper disposal of used needles. Patient demonstrated proper insulin injection technique while administering first dose in office. Patient verbalized understanding of how to recognize and treat hypoglycemia, and how to increase insulin dose by 1 unit daily until fasting blood glucose is less than 100. Plan to follow up in Rx clinic next week.  Medication Samples have been provided to the patient.  Drug name: Lantus Pen       Strength: 100units/ml        Qty: 1  LOT: [id number]  Exp.Date: 08/29/2018  Dosing instructions: started 10units in office today, increase by 1 unit daily until fasting blood sugars ~ 100.   The patient has been instructed regarding the correct time, dose, and frequency of taking this medication, including desired effects and most common side effects.   Leavy Cella 3:12 PM 02/01/2017   Patient seen with Cleotis Lema, PharmD Candidate, and Charlene Brooke, PharmD PGY-1 Resident.

## 2017-02-01 NOTE — Progress Notes (Signed)
    Subjective:  Alexandra Wilson is a 56 y.o. female who presents to the [facility] today for diabetes follow up.  HPI:  Diabetes - Previously on metformin, jardiance, glipizide but has been off all medications for about 1 year  - Used to check blood sugars a year ago but stopped because "it was discouraging" - Thinks sugars have been high because feels weak and tired - Has never been on insulin before - Diabetic Review of Systems - diabetic diet compliance: noncompliant some of the time, further diabetic ROS: no chest pain, dyspnea or TIA's, has noted excessive thirstiness and frequent urination, has dysesthesias in the feet with pain starting 6 months ago along with numbness in her feet that is worse when she is trying to go to sleep at night  ROS: Per HPI  Objective:  Physical Exam: BP 140/82   Pulse (!) 104   Temp 98.3 F (36.8 C) (Oral)   Ht 5\' 6"  (1.676 m)   Wt 183 lb (83 kg)   LMP  (LMP Unknown)   SpO2 98%   BMI 29.54 kg/m   Gen: NAD, resting comfortably CV: RRR with no murmurs appreciated Pulm: NWOB, CTAB with no crackles, wheezes, or rhonchi GI: Normal bowel sounds present. Soft, Nontender, Nondistended. MSK: no edema, cyanosis, or clubbing noted Skin: warm, dry Neuro: grossly normal, moves all extremities Psych: Normal affect and thought content  POCT glycosylated hemoglobin (Hb A1C)     Status: Abnormal   Collection Time: 02/01/17 11:02 AM  Result Value Ref Range   Hemoglobin A1C 11.3     Assessment/Plan:  No problem-specific Assessment & Plan notes found for this encounter.   Bufford Lope, DO PGY-2, [facility] 02/01/2017 10:20 AM

## 2017-02-02 LAB — LIPID PANEL
CHOLESTEROL TOTAL: 210 mg/dL — AB (ref 100–199)
Chol/HDL Ratio: 3.8 ratio (ref 0.0–4.4)
HDL: 56 mg/dL (ref >39)
LDL CALC: 140 mg/dL — AB (ref 0–99)
TRIGLYCERIDES: 68 mg/dL (ref 0–149)
VLDL Cholesterol Cal: 14 mg/dL (ref 5–40)

## 2017-02-02 LAB — CBC
HEMOGLOBIN: 14 g/dL (ref 11.1–15.9)
Hematocrit: 43.4 % (ref 34.0–46.6)
MCH: 25.4 pg — ABNORMAL LOW (ref 26.6–33.0)
MCHC: 32.3 g/dL (ref 31.5–35.7)
MCV: 79 fL (ref 79–97)
Platelets: 218 10*3/uL (ref 150–379)
RBC: 5.51 x10E6/uL — ABNORMAL HIGH (ref 3.77–5.28)
RDW: 16 % — ABNORMAL HIGH (ref 12.3–15.4)
WBC: 12.5 10*3/uL — ABNORMAL HIGH (ref 3.4–10.8)

## 2017-02-02 LAB — BASIC METABOLIC PANEL
BUN/Creatinine Ratio: 10 (ref 9–23)
BUN: 7 mg/dL (ref 6–24)
CALCIUM: 9.5 mg/dL (ref 8.7–10.2)
CO2: 24 mmol/L (ref 20–29)
CREATININE: 0.72 mg/dL (ref 0.57–1.00)
Chloride: 100 mmol/L (ref 96–106)
GFR calc Af Amer: 108 mL/min/{1.73_m2} (ref >59)
GFR calc non Af Amer: 94 mL/min/{1.73_m2} (ref >59)
GLUCOSE: 262 mg/dL — AB (ref 65–99)
Potassium: 4.4 mmol/L (ref 3.5–5.2)
SODIUM: 138 mmol/L (ref 134–144)

## 2017-02-02 LAB — MICROALBUMIN, URINE: Microalbumin, Urine: 23 ug/mL

## 2017-02-02 LAB — HEPATITIS C ANTIBODY: Hep C Virus Ab: 0.1 s/co ratio (ref 0.0–0.9)

## 2017-02-02 NOTE — Assessment & Plan Note (Addendum)
Uncontrolled a1c elevated at 11.3 and noncompliant. Now with diabetic neuropathy in b/l feet.  - Restart metformin, patient instructed to titrate up to metformin 1000mg  BID  - Restart jardiance 10mg  qd - Start lantus 10mg , and titrate based on fasting blood sugars. Warm hand off to pharmacy for teaching and more education - Follow up in 2 weeks with blood glucose log

## 2017-02-03 ENCOUNTER — Encounter: Payer: Self-pay | Admitting: Family Medicine

## 2017-02-11 ENCOUNTER — Ambulatory Visit (INDEPENDENT_AMBULATORY_CARE_PROVIDER_SITE_OTHER): Payer: 59 | Admitting: Pharmacist

## 2017-02-11 ENCOUNTER — Encounter: Payer: Self-pay | Admitting: Pharmacist

## 2017-02-11 DIAGNOSIS — E119 Type 2 diabetes mellitus without complications: Secondary | ICD-10-CM

## 2017-02-11 DIAGNOSIS — E7800 Pure hypercholesterolemia, unspecified: Secondary | ICD-10-CM | POA: Diagnosis not present

## 2017-02-11 MED ORDER — ASPIRIN 81 MG PO TABS: 81.0000 mg | ORAL_TABLET | Freq: Every day | ORAL | Status: DC

## 2017-02-11 MED ORDER — METFORMIN HCL ER 500 MG PO TB24: 500.0000 mg | ORAL_TABLET | Freq: Every day | ORAL | 3 refills | Status: DC

## 2017-02-11 MED ORDER — ATORVASTATIN CALCIUM 40 MG PO TABS: 40.0000 mg | ORAL_TABLET | Freq: Every day | ORAL | 3 refills | Status: DC

## 2017-02-11 MED ORDER — INSULIN GLARGINE 100 UNIT/ML SOLOSTAR PEN: 18.0000 [IU] | PEN_INJECTOR | Freq: Every day | SUBCUTANEOUS | 99 refills | Status: DC

## 2017-02-11 NOTE — Assessment & Plan Note (Signed)
Diabetes longstanding currently uncontrolled with A1c 11.3 and reported FBG 220-240. Patient denies hypoglycemic events and is able to verbalize appropriate hypoglycemia management plan. Patient reports adherence with medication. Reports diarrhea with increase in metformin dose; already is taking with meals and is titrating dose slowly at direction of her doctor from 500 mg once daily to 500 mg BID. Control is suboptimal due to suboptimal therapy and poor diet.  -Change Metformin from IR to XR, 500 mg daily with goal of no diarrhea -Increased lantus to 18 units injected daily, titrate by 1 unit every day until FBG in the 100's asked to continue increasing to a dose of 20 units or until CBGs are 100-200.  Next A1C anticipated November 2018.

## 2017-02-11 NOTE — Patient Instructions (Addendum)
Thank you for coming to see [place] today!  1. Increase your lantus to 18 units injected in the morning. Make sure to take your blood sugar, then give yourself the injection. As we were doing before, increase your dose by 1 unit a day until you are seeing numbers in the 100's.  2. Start taking metformin extended release, 500 mg tablets daily.  3. Start taking baby aspirin (81 mg) once every day. You can buy this over the counter.  4. Start taking atorvastatin 40 mg once every day. We will send a prescription to your pharmacy for this medication. This, along with the aspirin, will help to prevent stroke or heart attack.   Follow up with Dr. Shawna Orleans next Monday and with Dr. Valentina Lucks at [facility] in 2-3 weeks

## 2017-02-11 NOTE — Assessment & Plan Note (Signed)
ASCVD risk greater than 7.5%. Started Aspirin 81 mg and Started atorvastatin 40 mg.  Patient educated on purpose, proper use and potential adverse effects of atorvastatin including myalgia.  Following instruction patient verbalized understanding of treatment plan.

## 2017-02-11 NOTE — Progress Notes (Signed)
    S:     Chief Complaint  Patient presents with  . Medication Management    Diabetes    Patient arrives in good spirits, ambulating without assistance.  Presents for diabetes evaluation, education, and management at the request of Dr. Shawna Orleans. Patient was referred on 02/01/17.  Patient was last seen by Primary Care Provider on 02/01/17. Patient was recently initiated on insulin at last visit with PCP. Reported fasting BG in the 220-240 range.   Patient reports smoking cigarettes  Patient reports Diabetes was diagnosed in 2013.   Patient reports adherence with medications.  Current diabetes medications include: metformin 500 mg BID, lantus 15 units daily, titrate by 1 unit a day; jardiance 10 mg daily. Reports diarrhea with metformin, is taking with meals   Patient denies hypoglycemic events.  Patient reported dietary habits: Eats 3 smalls meals/day Breakfast: eggs, cereal, french toast with small amount of syrup Lunch: ceasar salad with chicken, sandwiches Dinner: meat + vegetable Drinks: water, sparkling water, soda (2 12 oz cans this week)  Patient reported exercise habits: cardio (stairstepper) ~15 minutes 2x this week   Patient reports nocturia. 1-2 times a day Patient reports neuropathy. Feet have felt numb, with improvement after initiation of medication Patient denies visual changes. Patient reports self foot exams.    O:  Physical Exam  Constitutional: She appears well-developed and well-nourished.  Vitals reviewed.    Review of Systems  All other systems reviewed and are negative.    Lab Results  Component Value Date   HGBA1C 11.3 02/01/2017   Vitals:   02/11/17 1553  BP: (!) 145/91  Pulse: (!) 102    Home fasting CBG: 220-240    10 year ASCVD risk: 13.1%.  A/P: Diabetes longstanding currently uncontrolled with A1c 11.3 and reported FBG 220-240. Patient denies hypoglycemic events and is able to verbalize appropriate hypoglycemia management plan.  Patient reports adherence with medication. Reports diarrhea with increase in metformin dose; already is taking with meals and is titrating dose slowly at direction of her doctor from 500 mg once daily to 500 mg BID. Control is suboptimal due to suboptimal therapy and poor diet.  -Change Metformin from IR to XR, 500 mg daily with goal of no diarrhea -Increased lantus to 18 units injected daily, titrate by 1 unit every day until FBG in the 100's asked to continue increasing to a dose of 20 units or until CBGs are 100-200.  Next A1C anticipated November 2018.    ASCVD risk greater than 7.5%. Started Aspirin 81 mg and Started atorvastatin 40 mg.  Patient educated on purpose, proper use and potential adverse effects of atorvastatin including myalgia.  Following instruction patient verbalized understanding of treatment plan.   Hypertension longstanding currently uncontrolled. Patient is not on any medications and plan to address at next visit. Consider urine protein evaluation at next visit.   Written patient instructions provided.  Total time in face to face counseling 30 minutes.   Follow up with Dr. Shawna Orleans next week and follow up with [facility] in 2-3 weeks. Patient seen with Cleotis Lema, PharmD Candidate, and Charlene Brooke, PharmD PGY-1 Resident.

## 2017-02-12 ENCOUNTER — Telehealth: Payer: Self-pay | Admitting: *Deleted

## 2017-02-12 MED ORDER — "INSULIN SYRINGE/NEEDLE 28G X 1/2"" 1 ML MISC": 1.0000 | 2 refills | Status: DC

## 2017-02-12 NOTE — Progress Notes (Signed)
Patient ID: Alexandra Wilson, female   DOB: 1960-10-29, 56 y.o.   MRN: [id number] Reviewed: Agree with Dr. Graylin Shiver documentation and management.

## 2017-02-12 NOTE — Telephone Encounter (Signed)
Rx sent 

## 2017-02-12 NOTE — Telephone Encounter (Signed)
Does not have Rx for pen needles on file. Please send Rx for pen needles to [facility] on [address]. Hubbard Hartshorn, RN, BSN

## 2017-02-15 ENCOUNTER — Ambulatory Visit: Payer: 59 | Admitting: Family Medicine

## 2017-02-15 MED ORDER — INSULIN PEN NEEDLE 31G X 8 MM MISC: 1.0000 | 3 refills | Status: DC

## 2017-02-15 NOTE — Addendum Note (Signed)
Addended by: Derl Barrow on: 02/15/2017 02:37 PM   Modules accepted: Orders

## 2017-02-15 NOTE — Telephone Encounter (Signed)
Patient called stating the wrong pen needles were sent in to her pharmacy. Patient just needed the pen needles and not the syringe.  Pen needles sent into patient's pharmacy.  Derl Barrow, RN

## 2017-02-26 ENCOUNTER — Encounter: Payer: Self-pay | Admitting: Family Medicine

## 2017-02-26 ENCOUNTER — Other Ambulatory Visit (HOSPITAL_COMMUNITY)
Admission: RE | Admit: 2017-02-26 | Discharge: 2017-02-26 | Disposition: A | Discharge status: Home / Self Care | Payer: 59 | Source: Ambulatory Visit | Attending: Family Medicine | Admitting: Family Medicine

## 2017-02-26 ENCOUNTER — Ambulatory Visit (INDEPENDENT_AMBULATORY_CARE_PROVIDER_SITE_OTHER): Payer: 59 | Admitting: Family Medicine

## 2017-02-26 VITALS — BP 120/74 | HR 109 | Ht 66.0 in | Wt 184.0 lb

## 2017-02-26 DIAGNOSIS — Z124 Encounter for screening for malignant neoplasm of cervix: Secondary | ICD-10-CM

## 2017-02-26 DIAGNOSIS — L858 Other specified epidermal thickening: Secondary | ICD-10-CM | POA: Diagnosis not present

## 2017-02-26 DIAGNOSIS — E119 Type 2 diabetes mellitus without complications: Secondary | ICD-10-CM

## 2017-02-26 DIAGNOSIS — R87610 Atypical squamous cells of undetermined significance on cytologic smear of cervix (ASC-US): Secondary | ICD-10-CM | POA: Insufficient documentation

## 2017-02-26 MED ORDER — INSULIN GLARGINE 100 UNIT/ML SOLOSTAR PEN
25.0000 [IU] | PEN_INJECTOR | Freq: Every day | SUBCUTANEOUS | 99 refills | Status: DC
Start: 2017-02-26 — End: 2017-02-26

## 2017-02-26 MED ORDER — INSULIN GLARGINE 100 UNIT/ML SOLOSTAR PEN: 25.0000 [IU] | PEN_INJECTOR | Freq: Every day | SUBCUTANEOUS | 99 refills | Status: DC

## 2017-02-26 NOTE — Progress Notes (Signed)
    Subjective:  Alexandra Wilson is a 56 y.o. female who presents to the [facility] today for pap   HPI:  Diabetes  - taking Metformin, jardiance, and Lantus 25U qd - [facility] - Feels improved with resolved foot pain - Sugars have been in a better range at home. No hypoglycemic events - No chest pain, shortness of breath, polyuria, polydipsia.   Health maintenance  - Wants pap today - Declined STD testing - Agreeable to colonoscopy - Does not want flu shot today   ROS: Per HPI  Objective:  Physical Exam: BP 120/74   Pulse (!) 109   Ht 5\' 6"  (1.676 m)   Wt 184 lb (83.5 kg)   LMP  (LMP Unknown)   SpO2 98%   BMI 29.70 kg/m   Gen: NAD, resting comfortably CV: RRR with no murmurs appreciated Pulm: NWOB, CTAB with no crackles, wheezes, or rhonchi GI: Normal bowel sounds present. Soft, Nontender, Nondistended. MSK: no edema, cyanosis, or clubbing noted Skin: warm, dry Neuro: grossly normal, moves all extremities Psych: Normal affect and thought content   Assessment/Plan:  Type 2 diabetes mellitus Uncontrolled but improved upon starting insulin. Lantus refilled today. No hypoglycemic events at home. Continue to follow with pharmacy clinic for insulin titration. Will check urine microalbumin creatinine ratio today. Will follow up on diabetic eye exam records. Follow-up with me in 2 months for recheck A1c  Health maintenance - Patient advised to call [facility] since is due for colonoscopy - Pap smear collected today - Declined flu shot today   Bufford Lope, DO PGY-2, [facility] 02/26/2017 3:07 PM

## 2017-02-26 NOTE — Patient Instructions (Addendum)
   It was good to see you today!  For your general health, - Thank you for getting your pap smear today. If results require attention, either myself or my nurse will get in touch with you. If everything is normal, you will get a letter in the mail or a message in My Chart. Please give [place] a call if you do not hear from [place] after 2 weeks. - Call  GI [phone] for your colonoscopy. Let me know if they tell you that you need a referral - For your face, I have placed a referral to dermatology, let us know if you have not heard about scheduling after 2 weeks.  Please check-out at the front desk before leaving the clinic. Make an appointment in 2 months for diabetes follow up.   Please bring all of your medications with you to each visit.   Sign up for My Chart to have easy access to your labs results, and communication with your primary care physician.  Feel free to call with any questions or concerns at any time, at [phone].   Take care,  Dr. Bufford Lope, [place]

## 2017-02-26 NOTE — Addendum Note (Signed)
Addended by: Bufford Lope on: 02/26/2017 04:01 PM   Modules accepted: Orders

## 2017-02-26 NOTE — Assessment & Plan Note (Addendum)
Uncontrolled but improved upon starting insulin. Lantus refilled today. No hypoglycemic events at home. Continue to follow with pharmacy clinic for insulin titration. Will check urine microalbumin creatinine ratio today. Will follow up on diabetic eye exam records. Follow-up with me in 2 months for recheck A1c

## 2017-02-27 LAB — MICROALBUMIN / CREATININE URINE RATIO
CREATININE, UR: 52.8 mg/dL
Microalb/Creat Ratio: 7 mg/g creat (ref 0.0–30.0)
Microalbumin, Urine: 3.7 ug/mL

## 2017-03-04 ENCOUNTER — Ambulatory Visit: Payer: 59 | Admitting: Pharmacist

## 2017-03-04 LAB — CYTOLOGY - PAP
Diagnosis: UNDETERMINED — AB
HPV: NOT DETECTED

## 2017-03-10 ENCOUNTER — Encounter: Payer: Self-pay | Admitting: Family Medicine

## 2018-11-01 ENCOUNTER — Telehealth: Payer: Self-pay | Admitting: *Deleted

## 2018-11-01 NOTE — Telephone Encounter (Signed)
LVM to return call. Patient has not been seen in over a year. She needs in person visit to follow up on diabetes. Alexandra Wilson

## 2021-02-20 ENCOUNTER — Encounter (HOSPITAL_COMMUNITY): Payer: Self-pay | Admitting: Emergency Medicine

## 2021-02-20 ENCOUNTER — Ambulatory Visit (HOSPITAL_COMMUNITY)
Admission: EM | Admit: 2021-02-20 | Discharge: 2021-02-20 | Disposition: A | Discharge status: Home / Self Care | Payer: 59

## 2021-02-20 ENCOUNTER — Other Ambulatory Visit: Payer: Self-pay

## 2021-02-20 DIAGNOSIS — U071 COVID-19: Secondary | ICD-10-CM | POA: Diagnosis not present

## 2021-02-20 NOTE — Discharge Instructions (Addendum)
Try AFRIN nasal spray. Do not use longer than 3 days. Recommend increased fluids, follow up if symptoms do not improve or you experience any shortness of breath, increased fever, etc.

## 2021-02-20 NOTE — ED Triage Notes (Signed)
Complains of extreme head stuffiness started last night.  Patient is covid positive on 02/18/2021.  Patient has had a slight cough, slight fever.

## 2021-02-20 NOTE — ED Provider Notes (Signed)
[facility]    CSN: [id number] Arrival date & time: 02/20/21  1541      History   Chief Complaint Chief Complaint  Patient presents with   URI    HPI Alexandra Wilson is a 60 y.o. female.   Patient here today for evaluation of significant nasal congestion. She states she is having trouble breathing through her nose which is creating a very dry mouth. States she has not tried any OTC meds. Tested positive for Covid a few days ago. Denies any significant cough or shortness of breath. No fever.    URI Presenting symptoms: congestion   Presenting symptoms: no cough, no ear pain, no fever and no rhinorrhea   Associated symptoms: no wheezing    Past Medical History:  Diagnosis Date   Diabetes mellitus type II 07/19/2011   Hyperlipidemia    Hypertension     Patient Active Problem List   Diagnosis Date Noted   History of abnormal Pap smear 08/21/2011   Plantar wart 08/17/2011   Type 2 diabetes mellitus ([place]) 07/19/2011   Tobacco abuse 07/19/2011   Hyperlipidemia 07/09/2011   Essential (primary) hypertension 07/08/2011    Past Surgical History:  Procedure Laterality Date   CESAREAN SECTION      OB History     Gravida  1   Para  1   Term      Preterm      AB      Living  1      SAB      IAB      Ectopic      Multiple      Live Births               Home Medications    Prior to Admission medications   Medication Sig Start Date End Date Taking? Authorizing Provider  aspirin 81 MG tablet Take 1 tablet (81 mg total) by mouth daily. Patient not taking: Reported on 02/20/2021 02/11/17   Zenia Resides, MD  atorvastatin (LIPITOR) 40 MG tablet Take 1 tablet (40 mg total) by mouth daily. Patient not taking: Reported on 02/20/2021 02/11/17   Zenia Resides, MD  Blood Glucose Monitoring Suppl (ONE TOUCH ULTRA SYSTEM KIT) W/DEVICE KIT 1 kit by Does not apply route once. May dispense generic as covered by insurance 07/09/11   Gerda Diss,  DO  empagliflozin (JARDIANCE) 10 MG TABS tablet Take 10 mg by mouth daily. Patient not taking: Reported on 02/20/2021 02/01/17   Bufford Lope, DO  Insulin Glargine (LANTUS SOLOSTAR) 100 UNIT/ML Solostar Pen Inject 25 Units into the skin daily at 6 (six) AM. Patient not taking: Reported on 02/20/2021 02/26/17   Bufford Lope, DO  Insulin Pen Needle 31G X 8 MM MISC 1 each by Does not apply route as directed. 02/15/17   Bufford Lope, DO  metFORMIN (GLUCOPHAGE-XR) 500 MG 24 hr tablet Take 1 tablet (500 mg total) by mouth daily with breakfast. Patient not taking: Reported on 02/20/2021 02/11/17   Zenia Resides, MD    Family History Family History  Problem Relation Age of Onset   Hypertension Mother    Cancer Sister    Diabetes Cousin        also maternal aunts   Colon cancer Neg Hx     Social History Social History   Tobacco Use   Smoking status: Every Day    Packs/day: 1.50    Years: 20.00    Pack  years: 30.00    Types: Cigarettes   Smokeless tobacco: Never  Vaping Use   Vaping Use: Never used  Substance Use Topics   Alcohol use: No   Drug use: No    Frequency: 3.0 times per week     Allergies   Patient has no known allergies.   Review of Systems Review of Systems  Constitutional:  Negative for activity change, chills and fever.  HENT:  Positive for congestion. Negative for ear pain and rhinorrhea.   Eyes:  Negative for discharge and redness.  Respiratory:  Negative for cough, shortness of breath and wheezing.     Physical Exam Triage Vital Signs ED Triage Vitals  Enc Vitals Group     BP 02/20/21 1620 (!) 168/100     Pulse Rate 02/20/21 1620 (!) 102     Resp 02/20/21 1620 20     Temp 02/20/21 1620 98.4 F (36.9 C)     Temp Source 02/20/21 1620 Oral     SpO2 02/20/21 1620 97 %     Weight --      Height --      Head Circumference --      Peak Flow --      Pain Score 02/20/21 1616 5     Pain Loc --      Pain Edu? --      Excl. in [place]? --    No data  found.  Updated Vital Signs BP (!) 168/100 (BP Location: Right Arm) Comment: not taking any medicine  Pulse (!) 102   Temp 98.4 F (36.9 C) (Oral)   Resp 20   LMP  (LMP Unknown)   SpO2 97%      Physical Exam Constitutional:      General: She is not in acute distress.    Appearance: Normal appearance. She is not ill-appearing.  HENT:     Head: Normocephalic and atraumatic.     Nose: Congestion present.  Eyes:     Conjunctiva/sclera: Conjunctivae normal.  Cardiovascular:     Rate and Rhythm: Normal rate and regular rhythm.     Heart sounds: Normal heart sounds. No murmur heard. Pulmonary:     Effort: Pulmonary effort is normal. No respiratory distress.     Breath sounds: Normal breath sounds. No wheezing, rhonchi or rales.  Neurological:     Mental Status: She is alert.  Psychiatric:        Mood and Affect: Mood normal.        Thought Content: Thought content normal.     UC Treatments / Results  Labs (all labs ordered are listed, but only abnormal results are displayed) Labs Reviewed - No data to display  EKG   Radiology No results found.  Procedures Procedures (including critical care time)  Medications Ordered in UC Medications - No data to display  Initial Impression / Assessment and Plan / UC Course  I have reviewed the triage vital signs and the nursing notes.  Pertinent labs & imaging results that were available during my care of the patient were reviewed by me and considered in my medical decision making (see chart for details).  Suspect symptoms are related to recent Covid 19  diagnosis. Recommended nasal spray for limited time. Caution advised with decongestants due to elevated BP in office- patient plans to make appointment with her PCP in near future and admits it has been a while since she has been seen by her PCP. Encouraged her to make this follow  up appointment soon. Encouraged sooner follow up with any worsening symptoms or any further concerns.    Final Clinical Impressions(s) / UC Diagnoses   Final diagnoses:  [id number]     Discharge Instructions      Try AFRIN nasal spray. Do not use longer than 3 days. Recommend increased fluids, follow up if symptoms do not improve or you experience any shortness of breath, increased fever, etc.      ED Prescriptions   None    PDMP not reviewed this encounter.   Francene Finders, PA-C 02/20/21 [phone]

## 2021-08-10 ENCOUNTER — Emergency Department (HOSPITAL_COMMUNITY): Payer: Managed Care, Other (non HMO)

## 2021-08-10 ENCOUNTER — Inpatient Hospital Stay (HOSPITAL_COMMUNITY): Payer: Managed Care, Other (non HMO)

## 2021-08-10 ENCOUNTER — Other Ambulatory Visit: Payer: Self-pay

## 2021-08-10 ENCOUNTER — Encounter (HOSPITAL_COMMUNITY): Payer: Self-pay

## 2021-08-10 ENCOUNTER — Observation Stay (HOSPITAL_COMMUNITY)
Admission: EM | Admit: 2021-08-10 | Discharge: 2021-08-11 | Disposition: A | Discharge status: Home / Self Care | Payer: Managed Care, Other (non HMO) | Attending: Family Medicine | Admitting: Family Medicine

## 2021-08-10 DIAGNOSIS — I639 Cerebral infarction, unspecified: Principal | ICD-10-CM

## 2021-08-10 DIAGNOSIS — R21 Rash and other nonspecific skin eruption: Secondary | ICD-10-CM | POA: Diagnosis present

## 2021-08-10 DIAGNOSIS — Z79899 Other long term (current) drug therapy: Secondary | ICD-10-CM | POA: Insufficient documentation

## 2021-08-10 DIAGNOSIS — E785 Hyperlipidemia, unspecified: Secondary | ICD-10-CM | POA: Diagnosis present

## 2021-08-10 DIAGNOSIS — Z7982 Long term (current) use of aspirin: Secondary | ICD-10-CM | POA: Diagnosis not present

## 2021-08-10 DIAGNOSIS — Z794 Long term (current) use of insulin: Secondary | ICD-10-CM | POA: Insufficient documentation

## 2021-08-10 DIAGNOSIS — I10 Essential (primary) hypertension: Secondary | ICD-10-CM | POA: Diagnosis not present

## 2021-08-10 DIAGNOSIS — R531 Weakness: Secondary | ICD-10-CM | POA: Diagnosis present

## 2021-08-10 DIAGNOSIS — E119 Type 2 diabetes mellitus without complications: Secondary | ICD-10-CM

## 2021-08-10 DIAGNOSIS — F17210 Nicotine dependence, cigarettes, uncomplicated: Secondary | ICD-10-CM | POA: Insufficient documentation

## 2021-08-10 DIAGNOSIS — Z20822 Contact with and (suspected) exposure to covid-19: Secondary | ICD-10-CM | POA: Diagnosis not present

## 2021-08-10 DIAGNOSIS — Z720 Tobacco use: Secondary | ICD-10-CM | POA: Diagnosis present

## 2021-08-10 DIAGNOSIS — Z7984 Long term (current) use of oral hypoglycemic drugs: Secondary | ICD-10-CM | POA: Diagnosis not present

## 2021-08-10 DIAGNOSIS — E7800 Pure hypercholesterolemia, unspecified: Secondary | ICD-10-CM

## 2021-08-10 LAB — URINALYSIS, ROUTINE W REFLEX MICROSCOPIC
Bilirubin Urine: NEGATIVE
Glucose, UA: NEGATIVE mg/dL
Hgb urine dipstick: NEGATIVE
Ketones, ur: NEGATIVE mg/dL
Nitrite: NEGATIVE
Protein, ur: NEGATIVE mg/dL
Specific Gravity, Urine: 1.011 (ref 1.005–1.030)
pH: 6 (ref 5.0–8.0)

## 2021-08-10 LAB — CBC WITH DIFFERENTIAL/PLATELET
Abs Immature Granulocytes: 0.04 10*3/uL (ref 0.00–0.07)
Basophils Absolute: 0.1 10*3/uL (ref 0.0–0.1)
Basophils Relative: 1 %
Eosinophils Absolute: 0.3 10*3/uL (ref 0.0–0.5)
Eosinophils Relative: 2 %
HCT: 45.3 % (ref 36.0–46.0)
Hemoglobin: 14.7 g/dL (ref 12.0–15.0)
Immature Granulocytes: 0 %
Lymphocytes Relative: 22 %
Lymphs Abs: 2.4 10*3/uL (ref 0.7–4.0)
MCH: 28.6 pg (ref 26.0–34.0)
MCHC: 32.5 g/dL (ref 30.0–36.0)
MCV: 88.1 fL (ref 80.0–100.0)
Monocytes Absolute: 0.6 10*3/uL (ref 0.1–1.0)
Monocytes Relative: 6 %
Neutro Abs: 7.7 10*3/uL (ref 1.7–7.7)
Neutrophils Relative %: 69 %
Platelets: 220 10*3/uL (ref 150–400)
RBC: 5.14 MIL/uL — ABNORMAL HIGH (ref 3.87–5.11)
RDW: 14.8 % (ref 11.5–15.5)
WBC: 11.1 10*3/uL — ABNORMAL HIGH (ref 4.0–10.5)
nRBC: 0 % (ref 0.0–0.2)

## 2021-08-10 LAB — PROTIME-INR
INR: 1 (ref 0.8–1.2)
Prothrombin Time: 13.6 seconds (ref 11.4–15.2)

## 2021-08-10 LAB — RESP PANEL BY RT-PCR (FLU A&B, COVID) ARPGX2
Influenza A by PCR: NEGATIVE
Influenza B by PCR: NEGATIVE
SARS Coronavirus 2 by RT PCR: NEGATIVE

## 2021-08-10 LAB — COMPREHENSIVE METABOLIC PANEL
ALT: 9 U/L (ref 0–44)
AST: 14 U/L — ABNORMAL LOW (ref 15–41)
Albumin: 3.9 g/dL (ref 3.5–5.0)
Alkaline Phosphatase: 71 U/L (ref 38–126)
Anion gap: 8 (ref 5–15)
BUN: 11 mg/dL (ref 8–23)
CO2: 25 mmol/L (ref 22–32)
Calcium: 9 mg/dL (ref 8.9–10.3)
Chloride: 104 mmol/L (ref 98–111)
Creatinine, Ser: 0.93 mg/dL (ref 0.44–1.00)
GFR, Estimated: >60 mL/min (ref >60)
Glucose, Bld: 175 mg/dL — ABNORMAL HIGH (ref 70–99)
Potassium: 3.9 mmol/L (ref 3.5–5.1)
Sodium: 137 mmol/L (ref 135–145)
Total Bilirubin: 0.4 mg/dL (ref 0.3–1.2)
Total Protein: 7.9 g/dL (ref 6.5–8.1)

## 2021-08-10 LAB — RAPID URINE DRUG SCREEN, HOSP PERFORMED
Amphetamines: NOT DETECTED
Barbiturates: NOT DETECTED
Benzodiazepines: NOT DETECTED
Cocaine: NOT DETECTED
Opiates: NOT DETECTED
Tetrahydrocannabinol: NOT DETECTED

## 2021-08-10 LAB — ETHANOL: Alcohol, Ethyl (B): <10 mg/dL (ref <10)

## 2021-08-10 LAB — APTT: aPTT: 36 seconds (ref 24–36)

## 2021-08-10 MED ORDER — INSULIN GLARGINE-YFGN 100 UNIT/ML ~~LOC~~ SOLN
10.0000 [IU] | Freq: Every day | SUBCUTANEOUS | Status: DC
  Administered 2021-08-11: 10 [IU] via SUBCUTANEOUS
  Filled 2021-08-10 (×3): qty 0.1

## 2021-08-10 MED ORDER — ASPIRIN 81 MG PO CHEW
81.0000 mg | CHEWABLE_TABLET | Freq: Once | ORAL | Status: AC
  Administered 2021-08-10: 81 mg via ORAL
  Filled 2021-08-10: qty 1

## 2021-08-10 MED ORDER — INSULIN ASPART 100 UNIT/ML IJ SOLN
0.0000 [IU] | INTRAMUSCULAR | Status: DC
  Administered 2021-08-11: 1 [IU] via SUBCUTANEOUS
  Administered 2021-08-11: 2 [IU] via SUBCUTANEOUS
  Filled 2021-08-10: qty 0.09

## 2021-08-10 MED ORDER — NICOTINE 21 MG/24HR TD PT24
21.0000 mg | MEDICATED_PATCH | Freq: Every day | TRANSDERMAL | Status: DC
  Administered 2021-08-11: 21 mg via TRANSDERMAL
  Filled 2021-08-10: qty 1

## 2021-08-10 MED ORDER — INSULIN GLARGINE 100 UNIT/ML SOLOSTAR PEN: 10.0000 [IU] | PEN_INJECTOR | Freq: Every day | SUBCUTANEOUS | Status: DC

## 2021-08-10 MED ORDER — CLOPIDOGREL BISULFATE 75 MG PO TABS
75.0000 mg | ORAL_TABLET | Freq: Once | ORAL | Status: DC
  Filled 2021-08-10: qty 1

## 2021-08-10 MED ORDER — HYDROCERIN EX CREA
TOPICAL_CREAM | Freq: Two times a day (BID) | CUTANEOUS | Status: DC
  Filled 2021-08-10: qty 113

## 2021-08-10 NOTE — ED Provider Notes (Signed)
°[facility]-EMERGENCY DEPT °Provider Note ° ° °CSN: [id number] °Arrival date & time: 08/10/21  1544 ° °  ° °History ° °No chief complaint on file. ° ° °Alexandra Wilson is a 60 y.o. female. ° °Patient with history of diabetes and hypertension presents today with chief complaint of right leg numbness.  She states that same began when she woke up this afternoon around 1 PM.  She states that she went to bed last night around 2 AM and was not experiencing symptoms at that time.  She denies any overuse, injuries, or heavy lifting recently, states that she mostly just laid around the house yesterday. Patient states she is able to ambulate without difficulty but does feel that planting her foot on the ground exacerbates her feel of numbness and she is falling off to the side somewhat with ambulation. Patient also states when she was driving this afternoon she couldn't feel the brake or gas pedal with her foot. She denies any headaches, vision changes, dizziness, weakness, chest pain, shortness of breath, nausea, or vomiting. Numbness isolated to the whole right leg, states that the numbness feeling ends near her right groin. She denies any pain in the leg. Of note, patient also smokes 1.5 packs/day. No hx of blood clots or strokes. ° °The history is provided by the patient. No language interpreter was used.  ° °  ° °Home Medications °Prior to Admission medications   °Medication Sig Start Date End Date Taking? Authorizing Provider  °aspirin 81 MG tablet Take 1 tablet (81 mg total) by mouth daily. °Patient not taking: Reported on 02/20/2021 02/11/17   Hensel, William A, MD  °atorvastatin (LIPITOR) 40 MG tablet Take 1 tablet (40 mg total) by mouth daily. °Patient not taking: Reported on 02/20/2021 02/11/17   Hensel, William A, MD  °Blood Glucose Monitoring Suppl (ONE TOUCH ULTRA SYSTEM KIT) W/DEVICE KIT 1 kit by Does not apply route once. May dispense generic as covered by insurance 07/09/11   Rigby, Michael D, DO   °empagliflozin (JARDIANCE) 10 MG TABS tablet Take 10 mg by mouth daily. °Patient not taking: Reported on 02/20/2021 02/01/17   Yoo, Elsia J, DO  °Insulin Glargine (LANTUS SOLOSTAR) 100 UNIT/ML Solostar Pen Inject 25 Units into the skin daily at 6 (six) AM. °Patient not taking: Reported on 02/20/2021 02/26/17   Yoo, Elsia J, DO  °Insulin Pen Needle 31G X 8 MM MISC 1 each by Does not apply route as directed. 02/15/17   Yoo, Elsia J, DO  °metFORMIN (GLUCOPHAGE-XR) 500 MG 24 hr tablet Take 1 tablet (500 mg total) by mouth daily with breakfast. °Patient not taking: Reported on 02/20/2021 02/11/17   Hensel, William A, MD  °   ° °Allergies    °Patient has no known allergies.   ° °Review of Systems   °Review of Systems  °Constitutional:  Negative for chills and fever.  °Respiratory:  Negative for shortness of breath.   °Cardiovascular:  Negative for chest pain and leg swelling.  °Gastrointestinal:  Negative for abdominal pain, diarrhea, nausea and vomiting.  °Genitourinary:  Negative for dysuria.  °Musculoskeletal:  Negative for back pain, neck pain and neck stiffness.  °Neurological:  Positive for numbness. Negative for dizziness, tremors, seizures, syncope, facial asymmetry, speech difficulty, weakness, light-headedness and headaches.  °Psychiatric/Behavioral:  Negative for confusion and decreased concentration.   °All other systems reviewed and are negative. ° °Physical Exam °Updated Vital Signs °BP (!) 157/88    Pulse 80    Temp 97.9 °F (  F (36.6 C) (Oral)    Resp 18    Ht 5' 6" (1.676 m)    Wt 85.7 kg    LMP  (LMP Unknown)    SpO2 100%    BMI 30.51 kg/m  Physical Exam Vitals and nursing note reviewed.  Constitutional:      General: She is not in acute distress.    Appearance: Normal appearance. She is obese. She is not ill-appearing, toxic-appearing or diaphoretic.     Comments: Patient resting comfortably in bed in no acute distress  HENT:     Head: Normocephalic and atraumatic.  Eyes:     Extraocular Movements:  Extraocular movements intact.     Pupils: Pupils are equal, round, and reactive to light.  Cardiovascular:     Rate and Rhythm: Normal rate and regular rhythm.     Pulses: Normal pulses.          Radial pulses are 2+ on the right side and 2+ on the left side.       Dorsalis pedis pulses are 2+ on the right side and 2+ on the left side.       Posterior tibial pulses are 2+ on the right side and 2+ on the left side.     Heart sounds: Normal heart sounds.  Pulmonary:     Effort: Pulmonary effort is normal.     Breath sounds: Normal breath sounds.  Abdominal:     General: Abdomen is flat.     Palpations: Abdomen is soft.  Musculoskeletal:     Cervical back: Normal range of motion and neck supple. No tenderness.  Skin:    General: Skin is warm and dry.  Neurological:     General: No focal deficit present.     Mental Status: She is alert and oriented to person, place, and time.     GCS: GCS eye subscore is 4. GCS verbal subscore is 5. GCS motor subscore is 6.     Sensory: Sensation is intact.     Motor: Motor function is intact.     Coordination: Coordination is intact.     Deep Tendon Reflexes:     Reflex Scores:      Patellar reflexes are 2+ on the right side and 2+ on the left side.      Achilles reflexes are 2+ on the right side and 2+ on the left side.    Comments: Alert and oriented to self, place, time and event.    Speech is fluent, clear without dysarthria or dysphasia.    Strength 5/5 in upper/lower extremities   Sensation appears to be intact in upper/lower extremities. She has difficulty differentiating sharp/dull in bilateral lower extremities. Point localization without deficit in bilateral lower extremities. Lower extremity reflexes intact and 2+ bilaterally  Patient able to ambulate without assistance, right leg does appear to lag slightly.   CN I not tested  CN II grossly intact visual fields bilaterally. Did not visualize posterior eye.  CN III, IV, VI PERRLA and  EOMs intact bilaterally  CN V Intact sensation to sharp and light touch to the face  CN VII facial movements symmetric  CN VIII not tested  CN IX, X no uvula deviation, symmetric rise of soft palate  CN XI 5/5 SCM and trapezius strength bilaterally  CN XII Midline tongue protrusion, symmetric L/R movements   Psychiatric:        Mood and Affect: Mood normal.        Behavior: Behavior  ED Results / Procedures / Treatments   °Labs °(all labs ordered are listed, but only abnormal results are displayed) °Labs Reviewed  °CBC WITH DIFFERENTIAL/PLATELET - Abnormal; Notable for the following components:  °    Result Value  ° WBC 11.1 (*)   ° RBC 5.14 (*)   ° All other components within normal limits  °COMPREHENSIVE METABOLIC PANEL - Abnormal; Notable for the following components:  ° Glucose, Bld 175 (*)   ° AST 14 (*)   ° All other components within normal limits  °URINALYSIS, ROUTINE W REFLEX MICROSCOPIC - Abnormal; Notable for the following components:  ° APPearance HAZY (*)   ° Leukocytes,Ua SMALL (*)   ° Bacteria, UA RARE (*)   ° All other components within normal limits  °RESP PANEL BY RT-PCR (FLU A&B, COVID) ARPGX2  °APTT  °RAPID URINE DRUG SCREEN, HOSP PERFORMED  °PROTIME-INR  °ETHANOL  ° ° °EKG °None ° °Radiology °MR BRAIN WO CONTRAST ° °Result Date: 08/10/2021 °CLINICAL DATA:  Initial evaluation for neuro deficit, stroke suspected. EXAM: MRI HEAD WITHOUT CONTRAST TECHNIQUE: Multiplanar, multiecho pulse sequences of the brain and surrounding structures were obtained without intravenous contrast. COMPARISON:  Head CT from earlier the same day. FINDINGS: Brain: Cerebral volume within normal limits for age. Scattered patchy T2/FLAIR hyperintensity involving the deep and subcortical white matter both cerebral hemispheres, nonspecific, but most like related chronic microvascular ischemic disease, mild to moderate in nature. Small remote lacunar infarct noted at the right periatrial white matter. Small  remote lacunar infarct noted at the anterior right caudate. Few additional remote lacunar infarcts present about the pons. Acute ischemic infarct involving the left paramedian pons measures 1.5 x 0.9 cm (series 5, image 61). An additional punctate acute to early subacute ischemic infarct present within the adjacent right pons as well (series 5, image 63). No associated hemorrhage or mass effect. No other evidence for acute or subacute ischemia elsewhere. Gray-white matter differentiation maintained. No areas of chronic cortical infarction. Few additional chronic micro hemorrhages noted about the deep gray nuclei, likely hypertensive in nature. No mass lesion, midline shift or mass effect. No hydrocephalus or extra-axial fluid collection. Pituitary gland suprasellar region normal. Midline structures intact. Vascular: Abnormal flow void within the right ICA to the terminus, likely occluded. Major intracranial vascular flow voids are otherwise maintained. Skull and upper cervical spine: Craniocervical junction within normal limits. Bone marrow signal intensity diffusely decreased on T1 weighted sequence, nonspecific, but most commonly related to anemia, smoking or obesity. No focal marrow replacing lesion. No scalp soft tissue abnormality. Sinuses/Orbits: Right gaze noted. Paranasal sinuses are largely clear. No significant mastoid effusion. Other: None. IMPRESSION: 1. 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 2. Additional punctate acute to early subacute ischemic nonhemorrhagic right pontine infarct. 3. Underlying mild to moderate chronic microvascular ischemic disease. Electronically Signed   By: Benjamin  McClintock M.D.   On: 08/10/2021 21:32  ° °CT HEAD CODE STROKE WO CONTRAST ° °Result Date: 08/10/2021 °CLINICAL DATA:  Code stroke. Neuro deficit, acute, stroke suspected. EXAM: CT HEAD WITHOUT CONTRAST TECHNIQUE: Contiguous axial images were obtained from the base of the skull through  the vertex without intravenous contrast. RADIATION DOSE REDUCTION: This exam was performed according to the departmental dose-optimization program which includes automated exposure control, adjustment of the mA and/or kV according to patient size and/or use of iterative reconstruction technique. COMPARISON:  07/08/2011 FINDINGS: Brain: There are chronic small-vessel ischemic changes of the pons, presumed old. No focal cerebellar finding. Cerebral   hemispheres show minimal small vessel change of the white matter. No cortical or large vessel territory infarction. No mass lesion, hemorrhage, hydrocephalus or extra-axial collection. Vascular: There is atherosclerotic calcification of the major vessels at the base of the brain. Skull: Negative Sinuses/Orbits: Clear/normal Other: None ASPECTS (Alberta Stroke Program Early CT Score) - Ganglionic level infarction (caudate, lentiform nuclei, internal capsule, insula, M1-M3 cortex): 7 - Supraganglionic infarction (M4-M6 cortex): 3 Total score (0-10 with 10 being normal): 10 IMPRESSION: 1. No acute CT finding. Old appearing small vessel infarctions in the pons. Mild chronic small-vessel change of the cerebral hemispheric white matter. 2. ASPECTS is 10 3. These results were called by telephone at the time of interpretation on 08/10/2021 at 7:53 pm to provider SARAH SMOOT , who verbally acknowledged these results. Electronically Signed   By: Mark  Shogry M.D.   On: 08/10/2021 19:54   ° °Procedures °Procedures  ° ° °Medications Ordered in ED °Medications - No data to display ° °ED Course/ Medical Decision Making/ A&P °  °                        °Medical Decision Making °Amount and/or Complexity of Data Reviewed °Labs: ordered. °Radiology: ordered. ° °Risk °OTC drugs. °Decision regarding hospitalization. ° ° °This patient presents to the ED for concern of right leg numbness, this involves an extensive number of treatment options, and is a complaint that carries with it a high risk  of complications and morbidity.  The differential diagnosis includes CVA, electrolyte abnormality, diabetic neuropathy, vascular occlusion ° ° °Co morbidities that complicate the patient evaluation ° °Htn, diabetes, smoker ° ° °Lab Tests: ° °I Ordered, and personally interpreted labs.  The pertinent results include:  WBC 11.1, no anemia. No electrolyte abnormalities or changes in kidney or liver function ° ° °Imaging Studies ordered: ° °I ordered imaging studies including CT head without contrast and MRI brain °I independently visualized and interpreted imaging which showed  °CT head °No acute CT finding. Old appearing small vessel infarctions in the pons. Mild chronic small-vessel change of the cerebral hemispheric white matter. °MRI brain °1.5 cm acute ischemic nonhemorrhagic left paramedian pontine infarct. °2. Additional punctate acute to early subacute ischemic nonhemorrhagic right pontine infarct. °3. Underlying mild to moderate chronic microvascular ischemic disease. °I agree with the radiologist interpretation ° ° °Cardiac Monitoring: ° °The patient was maintained on a cardiac monitor.  I personally viewed and interpreted the cardiac monitored which showed an underlying rhythm of: sinus rhythm ° ° °Reevaluation: ° °After the interventions noted above, I reevaluated the patient and found that they have :stayed the same ° ° °Dispostion: ° °After consideration of the diagnostic results and the patients response to treatment, I feel that the patent would benefit from inpatient admission for stroke management. ° °Patient with acute stroke on MRI, last known normal when she went to be last night at 1 am. On thorough neuro exam, patients only deficit is mild sensation changes to the right leg. Plan to consult neurology for admission. ° °Care handoff to Dr. Nanavati at shift change. Please see their note for further management and dispo. ° ° °This is a shared visit with supervising physician Dr. Nanavati who has  independently evaluated patient & provided guidance in evaluation/management/disposition, in agreement with care  °   ° °Final Clinical Impression(s) / ED Diagnoses °Final diagnoses:  °Stroke (cerebrum) (HCC)  °Type 2 diabetes mellitus without complication, without long-term current use of insulin (HCC)  °  Pure hypercholesterolemia  °Cerebrovascular accident (CVA), unspecified mechanism (HCC)  ° ° °Rx / DC Orders °ED Discharge Orders   ° ° None  ° °  ° ° °  °Smoot, Sarah A, PA-C °08/11/21 1742 ° °  °Nanavati, Ankit, MD °08/17/21 0742 ° °

## 2021-08-10 NOTE — ED Triage Notes (Signed)
Patient states she had right leg numbness when she woke this PM at 1300. Patient states she is able to stand on her right leg, but feels "like she is tipping over some." Patient states when she was driving this afternoon she couldn't feel the brake or gas pedal with her foot. Patient denies any blurred vision, dizziness, or numbness anywhere else

## 2021-08-10 NOTE — Subjective & Objective (Signed)
Right leg numbness and heaviness since 1 PM She was unable to feel the car break  She feel unstable standing up Otherwise denies any slurred speech any other complaints

## 2021-08-10 NOTE — Assessment & Plan Note (Signed)
Check lipid Panel continue Lipitor

## 2021-08-10 NOTE — Assessment & Plan Note (Signed)
Wound benefit from dermatology follow up

## 2021-08-10 NOTE — Assessment & Plan Note (Signed)
-   Spoke about importance of quitting spent 5 minutes discussing options for treatment, prior attempts at quitting, and dangers of smoking ? -At this point patient is    interested in quitting ? - order nicotine patch  ? - nursing tobacco cessation protocol ? ?

## 2021-08-10 NOTE — Progress Notes (Addendum)
Brief Neuro Note:  Alexandra Wilson is a 61 y.o. female with hx of DM2, HTN, HLD who presents with R leg numbness + heaviness. She was unable to feel the car brakes under her foot. MRI Brain demonstrated a 1.5cm L pontine ischemic stroke.  Plan:  Recommend that primary team order following: - Frequent Neuro checks per stroke unit protocol - Recommend Vascular imaging with MRA Angio Head without contrast and US Carotid doppler - Recommend obtaining TTE - Recommend obtaining Lipid panel with LDL - Please start statin if LDL > 70 - Recommend HbA1c - Antithrombotic - Aspirin 81mg  daily along with plavix 75mg  daily for 21 days followed by Aspirin 81mg  daily alone. - Recommend DVT ppx - SBP goal - permissive hypertension first 24 h < 220/110. Held home meds.  - Recommend Telemetry monitoring for arrythmia - Recommend bedside swallow screen prior to PO intake. - Stroke education booklet - Recommend PT/OT/SLP consult - Transfer to [facility] for admission and further evaluation for stroke. - Please page neurology when patient has arrived at [facility].  [place] Pager Number [phone]

## 2021-08-10 NOTE — Assessment & Plan Note (Addendum)
-   Order Sensitive  SSI  restart  Lantus 10 while hospitalized for now adjust as needed  - Hold by mouth medications

## 2021-08-10 NOTE — H&P (Signed)
Alexandra Wilson EOF:[id number] DOB: [birth year] DOA: 08/10/2021     PCP: Pcp, No   Outpatient Specialists:      Patient arrived to ER on 08/10/21 at 1544 Referred by Attending Toy Baker, MD   Patient coming from:    home Lives  With family    Chief Complaint:  right side weakness   HPI: Alexandra Wilson is a 62 y.o. female with medical history significant of HTN DM2 HLD     Presented with  right side weakness Right leg numbness and heaviness since 1 PM She was unable to feel the car break  She feel unstable standing up Otherwise denies any slurred speech any other complaints  Has not been using insulin for the past 2 years Not on BP meds have not been taking her Lipitor she had no insurance due to job loss Smokes tobacco 1.5 pack a day No EtOH    Initial COVID TEST  NEGATIVE   Lab Results  Component Value Date   [place] NEGATIVE 08/10/2021     Regarding pertinent Chronic problems:    Hyperlipidemia -  on statins noncompliant due to loss of insurance Lipid Panel     Component Value Date/Time   CHOL 210 (H) 02/01/2017 1135   TRIG 68 02/01/2017 1135   HDL 56 02/01/2017 1135   CHOLHDL 3.8 02/01/2017 1135   CHOLHDL 3.8 05/11/2014 1023   VLDL 16 05/11/2014 1023   [place] 140 (H) 02/01/2017 1135   LDLDIRECT 114 (H) 12/01/2011 1658   LABVLDL 14 02/01/2017 1135     HTN on not taking any medications       DM 2 -  Lab Results  Component Value Date   HGBA1C 11.3 02/01/2017   on insulin, but not compliant      While in ER:   Initial CT unremarkable but MRI was done and showed a pontine infarct neurology has been consulted who recommended transfer to [facility] and CVA work-up    Ordered  CT HEAD   NON acute  CXR -  NON acute  MRI - 1.5 cm acute ischemic nonhemorrhagic left paramedian pontine infarct. 2. Additional punctate acute to early subacute ischemic nonhemorrhagic right pontine infarct Following Medications were ordered in  ER: Medications  clopidogrel (PLAVIX) tablet 75 mg (has no administration in time range)  insulin aspart (novoLOG) injection 0-9 Units (has no administration in time range)  aspirin chewable tablet 81 mg (81 mg Oral Given 08/10/21 2258)    _______________________________________________________ ER Provider Called: NEurology     Dr. Lorrin Goodell They Recommend admit to medicine   Will see in AM     ED Triage Vitals  Enc Vitals Group     BP 08/10/21 1601 (!) 169/92     Pulse Rate 08/10/21 1601 93     Resp 08/10/21 1737 10     Temp 08/10/21 1601 97.9 F (36.6 C)     Temp Source 08/10/21 1601 Oral     SpO2 08/10/21 1601 98 %     Weight 08/10/21 1606 189 lb (85.7 kg)     Height 08/10/21 1606 5' 6"  (1.676 m)     Head Circumference --      Peak Flow --      Pain Score 08/10/21 1605 8     Pain Loc --      Pain Edu? --      Excl. in [place]? --   TMAX(24)@     _________________________________________ Significant initial  Findings: Abnormal Labs  Reviewed  CBC WITH DIFFERENTIAL/PLATELET - Abnormal; Notable for the following components:      Result Value   WBC 11.1 (*)    RBC 5.14 (*)    All other components within normal limits  COMPREHENSIVE METABOLIC PANEL - Abnormal; Notable for the following components:   Glucose, Bld 175 (*)    AST 14 (*)    All other components within normal limits  URINALYSIS, ROUTINE W REFLEX MICROSCOPIC - Abnormal; Notable for the following components:   APPearance HAZY (*)    Leukocytes,Ua SMALL (*)    Bacteria, UA RARE (*)    All other components within normal limits     ECG: Ordered Personally reviewed by me showing: HR : 82 Rhythm:  NSR,     no evidence of ischemic changes QTC 448     The recent clinical data is shown below. Vitals:   08/10/21 1955 08/10/21 2000 08/10/21 2015 08/10/21 2030  BP: (!) 176/102 (!) 157/83 (!) 170/93 (!) 151/95  Pulse: 80 87 81 86  Resp: 19 17 20  (!) 21  Temp:      TempSrc:      SpO2: 100% 98% 99% 100%  Weight:       Height:         WBC     Component Value Date/Time   WBC 11.1 (H) 08/10/2021 1637   LYMPHSABS 2.4 08/10/2021 1637   MONOABS 0.6 08/10/2021 1637   EOSABS 0.3 08/10/2021 1637   BASOSABS 0.1 08/10/2021 1637      UA   no evidence of UTI     Urine analysis:    Component Value Date/Time   COLORURINE YELLOW 08/10/2021 2016   APPEARANCEUR HAZY (A) 08/10/2021 2016   LABSPEC 1.011 08/10/2021 2016   PHURINE 6.0 08/10/2021 2016   GLUCOSEU NEGATIVE 08/10/2021 2016   HGBUR NEGATIVE 08/10/2021 2016   [place] NEGATIVE 08/10/2021 2016   [place] NEGATIVE 08/10/2021 2016   PROTEINUR NEGATIVE 08/10/2021 2016   UROBILINOGEN 1.0 07/08/2011 1322   NITRITE NEGATIVE 08/10/2021 2016   LEUKOCYTESUR SMALL (A) 08/10/2021 2016    Results for orders placed or performed during the hospital encounter of 08/10/21  Resp Panel by RT-PCR (Flu A&B, Covid) Nasopharyngeal Swab     Status: None   Collection Time: 08/10/21  7:32 PM   Specimen: Nasopharyngeal Swab; Nasopharyngeal(NP) swabs in vial transport medium  Result Value Ref Range Status   SARS Coronavirus 2 by RT PCR NEGATIVE NEGATIVE Final         Influenza A by PCR NEGATIVE NEGATIVE Final   Influenza B by PCR NEGATIVE NEGATIVE Final          _______________________________________________ Hospitalist was called for admission for CVA  The following Work up has been ordered so far:  Orders Placed This Encounter  Procedures   Resp Panel by RT-PCR (Flu A&B, Covid) Nasopharyngeal Swab   CT HEAD CODE STROKE WO CONTRAST   MR BRAIN WO CONTRAST   DG CHEST PORT 1 VIEW   CBC with Differential   Comprehensive metabolic panel   APTT   Urine rapid drug screen (hosp performed)   Urinalysis, Routine w reflex microscopic   Protime-INR   Ethanol   Hemoglobin A1c   Differential   Hepatic function panel   Magnesium   Phosphorus   TSH   Cardiac monitoring   STAT CBG when hypoglycemia is suspected. If treated, recheck every 15 minutes after each  treatment until CBG >/= 70 mg/dl   Refer to Hypoglycemia  Protocol Sidebar Report for treatment of CBG < 70 mg/dl   Consult to hospitalist   ED EKG   EKG 12-Lead   Admit to Inpatient (patient's expected length of stay will be greater than 2 midnights or inpatient only procedure)     OTHER Significant initial  Findings:  labs showing:    Recent Labs  Lab 08/10/21 1637  NA 137  K 3.9  CO2 25  GLUCOSE 175*  BUN 11  CREATININE 0.93  CALCIUM 9.0    Cr  stable,    Lab Results  Component Value Date   CREATININE 0.93 08/10/2021   CREATININE 0.72 02/01/2017   CREATININE 0.91 02/17/2015    Recent Labs  Lab 08/10/21 1637  AST 14*  ALT 9  ALKPHOS 71  BILITOT 0.4  PROT 7.9  ALBUMIN 3.9   Lab Results  Component Value Date   CALCIUM 9.0 08/10/2021    Plt: Lab Results  Component Value Date   PLT 220 08/10/2021     COVID-19 Labs  No results for input(s): DDIMER, FERRITIN, LDH, CRP in the last 72 hours.  Lab Results  Component Value Date   SARSCOV2NAA NEGATIVE 08/10/2021        Recent Labs  Lab 08/10/21 1637  WBC 11.1*  NEUTROABS 7.7  HGB 14.7  HCT 45.3  MCV 88.1  PLT 220    HG/HCT stable,       Component Value Date/Time   HGB 14.7 08/10/2021 1637   HGB 14.0 02/01/2017 1135   HCT 45.3 08/10/2021 1637   HCT 43.4 02/01/2017 1135   MCV 88.1 08/10/2021 1637   MCV 79 02/01/2017 1135         DM  labs:  HbA1C: No results for input(s): HGBA1C in the last 8760 hours.     CBG (last 3)  No results for input(s): GLUCAP in the last 72 hours.        Cultures:    Component Value Date/Time   SDES VAGINA 02/17/2015 1812   SPECREQUEST Normal 02/17/2015 1812   CULT  02/17/2015 1812    No Herpes Simplex Virus detected. Performed at [facility] 02/20/2015 FINAL 02/17/2015 1812     Radiological Exams on Admission: MR BRAIN WO CONTRAST  Result Date: 08/10/2021 CLINICAL DATA:  Initial evaluation for neuro deficit, stroke  suspected. EXAM: MRI HEAD WITHOUT CONTRAST TECHNIQUE: Multiplanar, multiecho pulse sequences of the brain and surrounding structures were obtained without intravenous contrast. COMPARISON:  Head CT from earlier the same day. FINDINGS: Brain: Cerebral volume within normal limits for age. Scattered patchy T2/FLAIR hyperintensity involving the deep and subcortical white matter both cerebral hemispheres, nonspecific, but most like related chronic microvascular ischemic disease, mild to moderate in nature. Small remote lacunar infarct noted at the right periatrial white matter. Small remote lacunar infarct noted at the anterior right caudate. Few additional remote lacunar infarcts present about the pons. Acute ischemic infarct involving the left paramedian pons measures 1.5 x 0.9 cm (series 5, image 61). An additional punctate acute to early subacute ischemic infarct present within the adjacent right pons as well (series 5, image 63). No associated hemorrhage or mass effect. No other evidence for acute or subacute ischemia elsewhere. Gray-white matter differentiation maintained. No areas of chronic cortical infarction. Few additional chronic micro hemorrhages noted about the deep gray nuclei, likely hypertensive in nature. No mass lesion, midline shift or mass effect. No hydrocephalus or extra-axial fluid collection. Pituitary gland suprasellar region normal. Midline structures  intact. Vascular: Abnormal flow void within the right ICA to the terminus, likely occluded. Major intracranial vascular flow voids are otherwise maintained. Skull and upper cervical spine: Craniocervical junction within normal limits. Bone marrow signal intensity diffusely decreased on T1 weighted sequence, nonspecific, but most commonly related to anemia, smoking or obesity. No focal marrow replacing lesion. No scalp soft tissue abnormality. Sinuses/Orbits: Right gaze noted. Paranasal sinuses are largely clear. No significant mastoid effusion.  Other: None. IMPRESSION: 1. 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 2. Additional punctate acute to early subacute ischemic nonhemorrhagic right pontine infarct. 3. Underlying mild to moderate chronic microvascular ischemic disease. Electronically Signed   By: Jeannine Boga M.D.   On: 08/10/2021 21:32   CT HEAD CODE STROKE WO CONTRAST  Result Date: 08/10/2021 CLINICAL DATA:  Code stroke. Neuro deficit, acute, stroke suspected. EXAM: CT HEAD WITHOUT CONTRAST TECHNIQUE: Contiguous axial images were obtained from the base of the skull through the vertex without intravenous contrast. RADIATION DOSE REDUCTION: This exam was performed according to the departmental dose-optimization program which includes automated exposure control, adjustment of the mA and/or kV according to patient size and/or use of iterative reconstruction technique. COMPARISON:  07/08/2011 FINDINGS: Brain: There are chronic small-vessel ischemic changes of the pons, presumed old. No focal cerebellar finding. Cerebral hemispheres show minimal small vessel change of the white matter. No cortical or large vessel territory infarction. No mass lesion, hemorrhage, hydrocephalus or extra-axial collection. Vascular: There is atherosclerotic calcification of the major vessels at the base of the brain. Skull: Negative Sinuses/Orbits: Clear/normal Other: None ASPECTS ([place] Stroke Program Early CT Score) - Ganglionic level infarction (caudate, lentiform nuclei, internal capsule, insula, M1-M3 cortex): 7 - Supraganglionic infarction (M4-M6 cortex): 3 Total score (0-10 with 10 being normal): 10 IMPRESSION: 1. No acute CT finding. Old appearing small vessel infarctions in the pons. Mild chronic small-vessel change of the cerebral hemispheric white matter. 2. ASPECTS is 10 3. These results were called by telephone at the time of interpretation on 08/10/2021 at 7:53 pm to provider [facility] , who verbally acknowledged these  results. Electronically Signed   By: Nelson Chimes M.D.   On: 08/10/2021 19:54   _______________________________________________________________________________________________________ Latest  Blood pressure (!) 151/95, pulse 86, temperature 97.9 F (36.6 C), temperature source Oral, resp. rate (!) 21, height 5' 6"  (1.676 m), weight 85.7 kg, SpO2 100 %.   Vitals  labs and radiology finding personally reviewed  Review of Systems:    Pertinent positives include:    localizing neurological complaints,   Constitutional:  No weight loss, night sweats, Fevers, chills, fatigue, weight loss  HEENT:  No headaches, Difficulty swallowing,Tooth/dental problems,Sore throat,  No sneezing, itching, ear ache, nasal congestion, post nasal drip,  Cardio-vascular:  No chest pain, Orthopnea, PND, anasarca, dizziness, palpitations.no Bilateral lower extremity swelling  GI:  No heartburn, indigestion, abdominal pain, nausea, vomiting, diarrhea, change in bowel habits, loss of appetite, melena, blood in stool, hematemesis Resp:  no shortness of breath at rest. No dyspnea on exertion, No excess mucus, no productive cough, No non-productive cough, No coughing up of blood.No change in color of mucus.No wheezing. Skin:  no rash or lesions. No jaundice GU:  no dysuria, change in color of urine, no urgency or frequency. No straining to urinate.  No flank pain.  Musculoskeletal:  No joint pain or no joint swelling. No decreased range of motion. No back pain.  Psych:  No change in mood or affect. No depression or anxiety. No memory  loss.  Neuro: nono tingling, no weakness, no double vision, no gait abnormality, no slurred speech, no confusion  All systems reviewed and apart from Alexandra Wilson all are negative _______________________________________________________________________________________________ Past Medical History:   Past Medical History:  Diagnosis Date   Diabetes mellitus type II 07/19/2011   Hyperlipidemia     Hypertension       Past Surgical History:  Procedure Laterality Date   CESAREAN SECTION      Social History:  Ambulatory   independently       reports that she has been smoking cigarettes. She has a 30.00 pack-year smoking history. She has never used smokeless tobacco. She reports that she does not drink alcohol and does not use drugs.     Family History:   Family History  Problem Relation Age of Onset   Hypertension Mother    Cancer Sister    Diabetes Cousin        also maternal aunts   Colon cancer Neg Hx    ______________________________________________________________________________________________ Allergies: No Known Allergies   Prior to Admission medications   Medication Sig Start Date End Date Taking? Authorizing Provider  aspirin 81 MG tablet Take 1 tablet (81 mg total) by mouth daily. Patient not taking: Reported on 02/20/2021 02/11/17   Zenia Resides, MD  atorvastatin (LIPITOR) 40 MG tablet Take 1 tablet (40 mg total) by mouth daily. Patient not taking: Reported on 02/20/2021 02/11/17   Zenia Resides, MD  Blood Glucose Monitoring Suppl (ONE TOUCH ULTRA SYSTEM KIT) W/DEVICE KIT 1 kit by Does not apply route once. May dispense generic as covered by insurance 07/09/11   Gerda Diss, DO  empagliflozin (JARDIANCE) 10 MG TABS tablet Take 10 mg by mouth daily. Patient not taking: Reported on 02/20/2021 02/01/17   Bufford Lope, DO  Insulin Glargine (LANTUS SOLOSTAR) 100 UNIT/ML Solostar Pen Inject 25 Units into the skin daily at 6 (six) AM. Patient not taking: Reported on 02/20/2021 02/26/17   Bufford Lope, DO  Insulin Pen Needle 31G X 8 MM MISC 1 each by Does not apply route as directed. 02/15/17   Bufford Lope, DO  metFORMIN (GLUCOPHAGE-XR) 500 MG 24 hr tablet Take 1 tablet (500 mg total) by mouth daily with breakfast. Patient not taking: Reported on 02/20/2021 02/11/17   Zenia Resides, MD     ___________________________________________________________________________________________________ Physical Exam: Vitals with BMI 08/10/2021 08/10/2021 08/10/2021  Height - - -  Weight - - -  BMI - - -  Systolic 147 829 562  Diastolic 95 93 83  Pulse 86 81 87     1. General:  in No  Acute distress    Chronically ill   -appearing 2. Psychological: Alert and   Oriented 3. Head/ENT:    Dry Mucous Membranes                          Head Non traumatic, neck supple                    Poor Dentition 4. SKIN:  decreased Skin turgor,  Skin clean Dry and intact  bilateral leg excoriation 5. Heart: Regular rate and rhythm no  Murmur, no Rub or gallop 6. Lungs:  no wheezes or crackles   7. Abdomen: Soft,  non-tender, Non distended   obese  bowel sounds present 8. Lower extremities: no clubbing, cyanosis, no  edema 9. Neurologically  strength 5 out of 5 in all  4 extremities cranial nerves II through XII intact 10. MSK: Normal range of motion    Chart has been reviewed  ______________________________________________________________________________________________  Assessment/Plan 61 y.o. female with medical history significant of HTN DM2 HLD  Admitted for  cva  Present on Admission:  Essential (primary) hypertension  Hyperlipidemia  CVA (cerebral vascular accident) ([place])  Tobacco abuse  Rash     Type 2 diabetes mellitus ([place])  - Order Sensitive  SSI  restart  Lantus 10 while hospitalized for now adjust as needed  - Hold by mouth medications     Essential (primary) hypertension Allow permissive hypertension  Hyperlipidemia Check lipid Panel continue Lipitor  CVA (cerebral vascular accident) ([place])  - will admit based on TIA/CVA protocol,        Monitor on Tele       MRI Resulted - showing acute ischemic CVA  1.5cm L pontine ischemic stroke.         Carotid Doppler and MRA ordered        Echo to evaluate for possible embolic source,        obtain cardiac enzymes,   ECG,   Lipid panel, TSH.        Order PT/OT evaluation.        keep nothing by mouth until passes swallow eval        Will make sure patient is on antiplatelet ASA 81 and Plavix for 21 days followed by Aspirin 43m daily alone. as per neurology  and statin        Allow permissive Hypertension keep BP <220/120        Neurology consulted   Tobacco abuse  - Spoke about importance of quitting spent 5 minutes discussing options for treatment, prior attempts at quitting, and dangers of smoking  -At this point patient is   interested in quitting  - order nicotine patch   - nursing tobacco cessation protocol   Rash Wound benefit from dermatology follow up     Other plan as per orders.  DVT prophylaxis:  SCD      Code Status:    Code Status: Not on file FULL CODE   as per patient   I had personally discussed CODE STATUS with patient      Family Communication:   Family not at  Bedside    Disposition Plan:         To home once workup is complete and patient is stable   Following barriers for discharge:                                                   Will need consultants to evaluate patient prior to discharge                        Would benefit from PT/OT eval prior to DC   Ordered                      Consults called: neurology is aware  Admission status:  ED Disposition     ED Disposition  Admit   Condition  --   [facility][[id number]]  Level of Care: Telemetry Medical [[id number]]  May admit patient to [facility] equivalent level of care is available::  No  Covid Evaluation: Asymptomatic Screening Protocol (No Symptoms)  Diagnosis: CVA (cerebral vascular accident) [facility]) [[id number]]  Admitting Physician: Toy Baker [[id number]]  Attending Physician: Toy Baker [[id number]]  Estimated length of stay: past midnight tomorrow  Certification:: I certify this patient will need inpatient services for at least 2  midnights           inpatient     I Expect 2 midnight stay secondary to severity of patient's current illness need for inpatient interventions justified by the following:    Severe lab/radiological/exam abnormalities including:    CVA and extensive comorbidities including:    DM2     That are currently affecting medical management.   I expect  patient to be hospitalized for 2 midnights requiring inpatient medical care.  Patient is at high risk for adverse outcome (such as loss of life or disability) if not treated.  Indication for inpatient stay as follows:     Need for V fluids, stroke work up    Level of care     tele    indefinitely please discontinue once patient no longer qualifies COVID-19 Labs    Lab Results  Component Value Date   [place] 08/10/2021     Precautions: admitted as   Covid Negative      Cherokee Boccio 08/10/2021, 11:53 PM    [facility]     after 2 AM please page floor coverage PA If 7AM-7PM, please contact the day team taking care of the patient using [email or web address]   Patient was evaluated in the context of the global COVID-19 pandemic, which necessitated consideration that the patient might be at risk for infection with the SARS-CoV-2 virus that causes COVID-19. Institutional protocols and algorithms that pertain to the evaluation of patients at risk for COVID-19 are in a state of rapid change based on information released by regulatory bodies including the CDC and federal and state organizations. These policies and algorithms were followed during the patient's care.

## 2021-08-10 NOTE — Assessment & Plan Note (Signed)
Allow permissive hypertension 

## 2021-08-10 NOTE — Assessment & Plan Note (Signed)
-   will admit based on TIA/CVA protocol,        Monitor on Tele       MRI Resulted - showing acute ischemic CVA  1.5cm L pontine ischemic stroke.         Carotid Doppler and MRA ordered        Echo to evaluate for possible embolic source,        obtain cardiac enzymes,  ECG,   Lipid panel, TSH.        Order PT/OT evaluation.        keep nothing by mouth until passes swallow eval        Will make sure patient is on antiplatelet ASA 81 and Plavix for 21 days followed by Aspirin 81mg  daily alone. as per neurology  and statin        Allow permissive Hypertension keep BP <220/120        Neurology consulted

## 2021-08-10 NOTE — ED Notes (Signed)
Blue, light green, dark green and lavender collected and sent to lab w/ pt labels.

## 2021-08-11 ENCOUNTER — Inpatient Hospital Stay (HOSPITAL_BASED_OUTPATIENT_CLINIC_OR_DEPARTMENT_OTHER): Payer: Managed Care, Other (non HMO)

## 2021-08-11 ENCOUNTER — Inpatient Hospital Stay (HOSPITAL_COMMUNITY): Payer: Managed Care, Other (non HMO)

## 2021-08-11 DIAGNOSIS — Z7982 Long term (current) use of aspirin: Secondary | ICD-10-CM | POA: Diagnosis not present

## 2021-08-11 DIAGNOSIS — I639 Cerebral infarction, unspecified: Secondary | ICD-10-CM | POA: Diagnosis not present

## 2021-08-11 DIAGNOSIS — I6389 Other cerebral infarction: Secondary | ICD-10-CM

## 2021-08-11 DIAGNOSIS — R21 Rash and other nonspecific skin eruption: Secondary | ICD-10-CM | POA: Diagnosis not present

## 2021-08-11 DIAGNOSIS — I6302 Cerebral infarction due to thrombosis of basilar artery: Secondary | ICD-10-CM | POA: Diagnosis not present

## 2021-08-11 DIAGNOSIS — I10 Essential (primary) hypertension: Secondary | ICD-10-CM | POA: Diagnosis not present

## 2021-08-11 DIAGNOSIS — Z20822 Contact with and (suspected) exposure to covid-19: Secondary | ICD-10-CM | POA: Diagnosis not present

## 2021-08-11 DIAGNOSIS — Z794 Long term (current) use of insulin: Secondary | ICD-10-CM | POA: Diagnosis not present

## 2021-08-11 DIAGNOSIS — Z7984 Long term (current) use of oral hypoglycemic drugs: Secondary | ICD-10-CM | POA: Diagnosis not present

## 2021-08-11 DIAGNOSIS — I6381 Other cerebral infarction due to occlusion or stenosis of small artery: Secondary | ICD-10-CM | POA: Diagnosis not present

## 2021-08-11 DIAGNOSIS — Z79899 Other long term (current) drug therapy: Secondary | ICD-10-CM | POA: Diagnosis not present

## 2021-08-11 DIAGNOSIS — R531 Weakness: Secondary | ICD-10-CM | POA: Diagnosis present

## 2021-08-11 DIAGNOSIS — E119 Type 2 diabetes mellitus without complications: Secondary | ICD-10-CM | POA: Diagnosis not present

## 2021-08-11 DIAGNOSIS — Z720 Tobacco use: Secondary | ICD-10-CM | POA: Diagnosis not present

## 2021-08-11 DIAGNOSIS — F17210 Nicotine dependence, cigarettes, uncomplicated: Secondary | ICD-10-CM | POA: Diagnosis not present

## 2021-08-11 DIAGNOSIS — E7800 Pure hypercholesterolemia, unspecified: Secondary | ICD-10-CM | POA: Diagnosis not present

## 2021-08-11 LAB — HEMOGLOBIN A1C
Hgb A1c MFr Bld: 6.4 % — ABNORMAL HIGH (ref 4.8–5.6)
Mean Plasma Glucose: 136.98 mg/dL

## 2021-08-11 LAB — DIFFERENTIAL
Abs Immature Granulocytes: 0.03 10*3/uL (ref 0.00–0.07)
Basophils Absolute: 0.1 10*3/uL (ref 0.0–0.1)
Basophils Relative: 1 %
Eosinophils Absolute: 0.4 10*3/uL (ref 0.0–0.5)
Eosinophils Relative: 3 %
Immature Granulocytes: 0 %
Lymphocytes Relative: 31 %
Lymphs Abs: 3.4 10*3/uL (ref 0.7–4.0)
Monocytes Absolute: 0.7 10*3/uL (ref 0.1–1.0)
Monocytes Relative: 6 %
Neutro Abs: 6.5 10*3/uL (ref 1.7–7.7)
Neutrophils Relative %: 59 %

## 2021-08-11 LAB — PHOSPHORUS: Phosphorus: 3.3 mg/dL (ref 2.5–4.6)

## 2021-08-11 LAB — HIV ANTIBODY (ROUTINE TESTING W REFLEX): HIV Screen 4th Generation wRfx: NONREACTIVE

## 2021-08-11 LAB — GLUCOSE, CAPILLARY
Glucose-Capillary: 159 mg/dL — ABNORMAL HIGH (ref 70–99)
Glucose-Capillary: 121 mg/dL — ABNORMAL HIGH (ref 70–99)
Glucose-Capillary: 132 mg/dL — ABNORMAL HIGH (ref 70–99)

## 2021-08-11 LAB — HEPATIC FUNCTION PANEL
ALT: 8 U/L (ref 0–44)
AST: 10 U/L — ABNORMAL LOW (ref 15–41)
Albumin: 3.3 g/dL — ABNORMAL LOW (ref 3.5–5.0)
Alkaline Phosphatase: 67 U/L (ref 38–126)
Bilirubin, Direct: <0.1 mg/dL (ref 0.0–0.2)
Total Bilirubin: 0.5 mg/dL (ref 0.3–1.2)
Total Protein: 7 g/dL (ref 6.5–8.1)

## 2021-08-11 LAB — LIPID PANEL
Cholesterol: 187 mg/dL (ref 0–200)
HDL: 54 mg/dL (ref >40)
LDL Cholesterol: 121 mg/dL — ABNORMAL HIGH (ref 0–99)
Total CHOL/HDL Ratio: 3.5 RATIO
Triglycerides: 61 mg/dL (ref <150)
VLDL: 12 mg/dL (ref 0–40)

## 2021-08-11 LAB — MAGNESIUM: Magnesium: 2.2 mg/dL (ref 1.7–2.4)

## 2021-08-11 LAB — TSH: TSH: 3.421 u[IU]/mL (ref 0.350–4.500)

## 2021-08-11 LAB — ECHOCARDIOGRAM COMPLETE
Area-P 1/2: 4.08 cm2
Height: 66 in
S' Lateral: 3 cm
Weight: 3024 oz

## 2021-08-11 MED ORDER — ACETAMINOPHEN 160 MG/5ML PO SOLN: 650.0000 mg | ORAL | Status: DC | PRN

## 2021-08-11 MED ORDER — STROKE: EARLY STAGES OF RECOVERY BOOK
Status: AC
  Filled 2021-08-11: qty 1

## 2021-08-11 MED ORDER — ASPIRIN 81 MG PO CHEW
81.0000 mg | CHEWABLE_TABLET | Freq: Every day | ORAL | Status: DC
  Administered 2021-08-11: 81 mg via ORAL
  Filled 2021-08-11: qty 1

## 2021-08-11 MED ORDER — CLOPIDOGREL BISULFATE 75 MG PO TABS
75.0000 mg | ORAL_TABLET | Freq: Every day | ORAL | Status: DC
  Administered 2021-08-11: 75 mg via ORAL
  Filled 2021-08-11: qty 1

## 2021-08-11 MED ORDER — ASPIRIN 81 MG PO TABS: 81.0000 mg | ORAL_TABLET | Freq: Every day | ORAL | 1 refills | Status: AC

## 2021-08-11 MED ORDER — NICOTINE 21 MG/24HR TD PT24: 21.0000 mg | MEDICATED_PATCH | Freq: Every day | TRANSDERMAL | 0 refills | Status: DC

## 2021-08-11 MED ORDER — STROKE: EARLY STAGES OF RECOVERY BOOK
Freq: Once | Status: AC
  Administered 2021-08-11: 1
  Filled 2021-08-11: qty 1

## 2021-08-11 MED ORDER — ATORVASTATIN CALCIUM 40 MG PO TABS
40.0000 mg | ORAL_TABLET | Freq: Every day | ORAL | Status: DC
  Administered 2021-08-11: 40 mg via ORAL
  Filled 2021-08-11: qty 1

## 2021-08-11 MED ORDER — CLOPIDOGREL BISULFATE 75 MG PO TABS: 75.0000 mg | ORAL_TABLET | Freq: Every day | ORAL | 0 refills | Status: AC

## 2021-08-11 MED ORDER — ACETAMINOPHEN 650 MG RE SUPP: 650.0000 mg | RECTAL | Status: DC | PRN

## 2021-08-11 MED ORDER — ACETAMINOPHEN 325 MG PO TABS: 650.0000 mg | ORAL_TABLET | ORAL | Status: DC | PRN

## 2021-08-11 MED ORDER — SODIUM CHLORIDE 0.9 % IV SOLN: INTRAVENOUS | Status: DC

## 2021-08-11 MED ORDER — METFORMIN HCL 500 MG PO TABS: 500.0000 mg | ORAL_TABLET | Freq: Two times a day (BID) | ORAL | 1 refills | Status: DC

## 2021-08-11 MED ORDER — ATORVASTATIN CALCIUM 40 MG PO TABS: 40.0000 mg | ORAL_TABLET | Freq: Every day | ORAL | 1 refills | Status: DC

## 2021-08-11 NOTE — Evaluation (Signed)
Physical Therapy Evaluation Patient Details Name: Alexandra Wilson MRN: [id number] DOB: 1960/10/11 Today's Date: 08/11/2021  History of Present Illness  Alexandra Wilson is a 61 y.o. 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 Pt presenting with numbness, impaired co-ordination, mild ataxia among R UE and LE. Pt with near scissoring gait pattern. Pt benefits from RW for ambulation to improve stability and decrease falls risk. Pt to benefit from outpt PT to address balance deficits, impaired co-ordination, and to improve ambulation to return to amb without AD. Acute PT to cont to follow.       Recommendations for follow up therapy are one component of a multi-disciplinary discharge planning process, led by the attending physician.  Recommendations may be updated based on patient status, additional functional criteria and insurance authorization.  Follow Up Recommendations Outpatient PT    Assistance Recommended at Discharge Intermittent Supervision/Assistance  Patient can return home with the following  A little help with walking and/or transfers;Assist for transportation    Equipment Recommendations Rolling walker (2 wheels)  Recommendations for Other Services       Functional Status Assessment Patient has had a recent decline in their functional status and demonstrates the ability to make significant improvements in function in a reasonable and predictable amount of time.     Precautions / Restrictions Precautions Precautions: Fall Precaution Comments: R sided humbness Restrictions Weight Bearing Restrictions: No      Mobility  Bed Mobility               General bed mobility comments: pt sitting up at EOB    Transfers Overall transfer level: Needs  assistance Equipment used: None Transfers: Sit to/from Stand Sit to Stand: Min guard           General transfer comment: pt min guard for initial stand but then required minA due to pt attempting to step with the R and it didn't move therefore caught herself on the bed    Ambulation/Gait Ambulation/Gait assistance: Min guard, Min assist Gait Distance (Feet): 150 Feet (x2) Assistive device: None, IV Pole, Rolling walker (2 wheels) Gait Pattern/deviations: Step-through pattern, Decreased stride length, Ataxic (near crossover gait pattern) Gait velocity: dec Gait velocity interpretation: 1.31 - 2.62 ft/sec, indicative of limited community ambulator   General Gait Details: pt is pigeon toed at baseline, pt near crossover gait pattern wiht R foot over L, pt unsteady but no overt LOB without AD, pt with improved stability when given IV pole, pt then trialed walker for second bout of amb and reports "Oh i feel a lot better with this" pt did demo improved stability, increased step length, pt did however have difficulty turning initially with RW due to R foot not clearing and being so close to L foot, near tripping self  Stairs            Wheelchair Mobility    Modified Rankin (Stroke Patients Only) Modified Rankin (Stroke Patients Only) Pre-Morbid Rankin Score: No symptoms Modified Rankin: Moderate disability     Balance Overall balance assessment: Mild deficits observed, not formally tested                                           Pertinent  Vitals/Pain Pain Assessment Pain Assessment: No/denies pain    Home Living Family/patient expects to be discharged to:: Private residence Living Arrangements: Children (son) Available Help at Discharge: Family;Available 24 hours/day Type of Home: House Home Access: Level entry       Home Layout: One level        Prior Function Prior Level of Function : Working/employed;Independent/Modified  Independent;Driving             Mobility Comments: 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, newspaper   Dominant Hand: Right    Extremity/Trunk Assessment   Upper Extremity Assessment Upper Extremity Assessment: Defer to OT evaluation    Lower Extremity Assessment Lower Extremity Assessment: LLE deficits/detail LLE Deficits / Details: grossly 4+/5 LLE Sensation: decreased light touch (noted Lower leg discoloration, reports heaviness and impaired sensation on bottom of foot when WBing but reports "both legs feel the same" when light touch applied.) LLE Coordination:  (increased time for heal to shin)    Cervical / Trunk Assessment Cervical / Trunk Assessment: Normal  Communication   Communication: No difficulties  Cognition Arousal/Alertness: Awake/alert Behavior During Therapy: WFL for tasks assessed/performed Overall Cognitive Status: Within Functional Limits for tasks assessed                                 General Comments: pt with mild delay in processing but functional        General Comments General comments (skin integrity, edema, etc.): VSS    Exercises     Assessment/Plan    PT Assessment Patient needs continued PT services  PT Problem List Decreased strength;Decreased activity tolerance;Decreased balance;Decreased mobility;Decreased coordination;Decreased knowledge of use of DME       PT Treatment Interventions DME instruction;Gait training;Functional mobility training;Therapeutic activities;Therapeutic exercise;Balance training    PT Goals (Current goals can be found in the Care Plan section)  Acute Rehab PT Goals Patient Stated Goal: to get better PT Goal Formulation: With patient Time For Goal Achievement: 08/25/21 Potential to Achieve Goals: Good    Frequency Min 4X/week     Co-evaluation               AM-PAC PT "6 Clicks" Mobility  Outcome Measure Help needed turning from your back to your side  while in a flat bed without using bedrails?: None Help needed moving from lying on your back to sitting on the side of a flat bed without using bedrails?: None Help needed moving to and from a bed to a chair (including a wheelchair)?: None Help needed standing up from a chair using your arms (e.g., wheelchair or bedside chair)?: A Little Help needed to walk in hospital room?: A Little Help needed climbing 3-5 steps with a railing? : A Little 6 Click Score: 21    End of Session Equipment Utilized During Treatment: Gait belt Activity Tolerance: Patient tolerated treatment well Patient left: in chair;with call bell/phone within reach Nurse Communication: Mobility status PT Visit Diagnosis: Unsteadiness on feet (R26.81);Muscle weakness (generalized) (M62.81);Difficulty in walking, not elsewhere classified (R26.2)    Time: 5732-2025 PT Time Calculation (min) (ACUTE ONLY): 24 min   Charges:   PT Evaluation $PT Eval Moderate Complexity: 1 Mod PT Treatments $Gait Training: 8-22 mins        Kittie Plater, PT, DPT Acute Rehabilitation Services Pager #: [phone] Office #: [phone]   Berline Lopes 08/11/2021, 9:59 AM

## 2021-08-11 NOTE — Progress Notes (Addendum)
Inpatient Diabetes Program Recommendations  AACE/ADA: New Consensus Statement on Inpatient Glycemic Control   Target Ranges:  Prepandial:   less than 140 mg/dL      Peak postprandial:   less than 180 mg/dL (1-2 hours)      Critically ill patients:  140 - 180 mg/dL    Latest Reference Range & Units 08/11/21 04:44 08/11/21 6:58 08/11/21 08:21  Glucose-Capillary 70 - 99 mg/dL 159 (H)  Novolog 2 units      Semglee 10 units 121 (H)  Novolog 1 unit    Latest Reference Range & Units 08/11/21 03:14  Hemoglobin A1C 4.8 - 5.6 % 6.4 (H)   Review of Glycemic Control  Diabetes history: DM2 Outpatient Diabetes medications: None Current orders for Inpatient glycemic control: Semglee 10 units daily, Novolog 0-9 units Q4H  Inpatient Diabetes Program Recommendations:    HbgA1C:  A1C 6.4% on 08/11/21 indicating an average glucose of 137 mg/dl over the past 2-3 months.  NOTE: Per chart, patient admitted with CVA and initial glucose 175 mg/dl on 08/10/21 at time of presentation to hospital. Per H&P, patient has not been taking insulin in over 2 years.  No insulin given on 08/10/21; glucose 159 mg/dl this morning at 4:44 am. Noted patient received Semglee 10 units at 6:58 am today. A1C 6.4% on 08/11/21 indicating an average glucose of 137 mg/dl and within ADA target A1C goal of less than 7%.  Will follow glucose trends.  Addendum 08/11/21@13 :15-Spoke with patient at bedside regarding DM control. Patient states that she does not take any DM medications currently as an outpatient for DM. Patient reports that she use to take insulin and oral DM medications in the past but it has been 2 years since she took any DM medication. Patient reports that she does not check glucose at home but her PCP checks glucose and A1C at appointments. Discussed current A1C of 6.4% on 08/11/21 indicating an average glucose of 137 mg/dl. Encouraged patient to continue to follow up with PCP regarding ongoing DM control. Patient  verbalized understanding of information and has no questions at this time related to DM.  Thanks, Barnie Alderman, RN, MSN, CDE Diabetes Coordinator Inpatient Diabetes Program [phone] (Team Pager from 8am to 5pm)

## 2021-08-11 NOTE — ED Notes (Signed)
Report given to receiving nurse at [facility] and [place].

## 2021-08-11 NOTE — Progress Notes (Signed)
Carotid duplex bilateral study completed.   Please see CV Proc for preliminary results.   Lorriann Hansmann, RDMS, RVT  

## 2021-08-11 NOTE — Evaluation (Signed)
Occupational Therapy Evaluation Patient Details Name: Alexandra Wilson MRN: [id number] DOB: May 04, 1961 Today's Date: 08/11/2021   History of Present Illness Alexandra Wilson is a 61 y.o. 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 Her son lives with her and can assist as needed at d/c. Upon evaluation pt was generally set up for upper ADLs, and min A-min guard for OOB tasks with RW. Pt is mostly limited by impaired coordination. Reviewed BE FAST stroke education with pt, she verbalized great understanding. Pt with active d/c plans fro home today, recommend follow up OT with neuro OP OT.      Recommendations for follow up therapy are one component of a multi-disciplinary discharge planning process, led by the attending physician.  Recommendations may be updated based on patient status, additional functional criteria and insurance authorization.   Follow Up Recommendations  Outpatient OT    Assistance Recommended at Discharge Intermittent Supervision/Assistance  Patient can return home with the following A little help with walking and/or transfers;A little help with bathing/dressing/bathroom;Assistance with cooking/housework;Help with stairs or ramp for entrance    Functional Status Assessment  Patient has had a recent decline in their functional status and demonstrates the ability to make significant improvements in function in a reasonable and predictable amount of time.  Equipment Recommendations  None recommended by OT       Precautions / Restrictions Precautions Precautions: Fall Precaution Comments: R sided numbness Restrictions Weight Bearing Restrictions: No      Mobility  Bed Mobility Overal bed mobility: Needs Assistance             General bed mobility comments: in chair upon arrival - likely mod I    Transfers Overall transfer level: Needs assistance Equipment used: Rolling walker (2 wheels) Transfers: Sit to/from Stand Sit to Stand: Min assist, Min guard           General transfer comment: min A 1x, progressed to min guard for safety      Balance Overall balance assessment: Mild deficits observed, not formally tested             ADL either performed or assessed with clinical judgement   ADL Overall ADL's : Needs assistance/impaired Eating/Feeding: Set up;Sitting Eating/Feeding Details (indicate cue type and reason): some difficulty with packaging Grooming: Set up Grooming Details (indicate cue type and reason): some difficutly with packaging Upper Body Bathing: Set up;Sitting   Lower Body Bathing: Minimal assistance;Sit to/from stand Lower Body Bathing Details (indicate cue type and reason): for balance Upper Body Dressing : Set up;Sitting   Lower Body Dressing: Minimal assistance;Sit to/from stand   Toilet Transfer: Minimal assistance;Rolling walker (2 wheels);Ambulation   Toileting- Clothing Manipulation and Hygiene: Supervision/safety;Sitting/lateral lean       Functional mobility during ADLs: Minimal assistance;Rolling walker (2 wheels) General ADL Comments: impaired balance when standing, beenfits from cues for safety     Vision Baseline Vision/History: 1 Wears glasses Ability to See in Adequate Light: 0 Adequate Patient Visual Report: No change from baseline Vision Assessment?: No apparent visual deficits Additional Comments: assessment was [facility]            Pertinent Vitals/Pain Pain Assessment Pain Assessment: No/denies pain     Hand Dominance Right  Extremity/Trunk Assessment Upper Extremity Assessment Upper Extremity Assessment: RUE deficits/detail RUE Deficits / Details: strength and ROM are  overall WFL. mild impaired coordination noted with thumb to finger, finger to nose and dysdiadochokinesia testing RUE Sensation: decreased light touch RUE Coordination: decreased fine motor   Lower Extremity Assessment Lower Extremity Assessment: Defer to PT evaluation LLE Deficits / Details: grossly 4+/5 LLE Sensation: decreased light touch (noted Lower leg discoloration, reports heaviness and impaired sensation on bottom of foot when WBing but reports "both legs feel the same" when light touch applied.) LLE Coordination:  (increased time for heal to shin)   Cervical / Trunk Assessment Cervical / Trunk Assessment: Normal   Communication Communication Communication: No difficulties   Cognition Arousal/Alertness: Awake/alert Behavior During Therapy: WFL for tasks assessed/performed Overall Cognitive Status: Within Functional Limits for tasks assessed         General Comments: pt with mild delay in processing but functional     General Comments  VSS on RA            Home Living Family/patient expects to be discharged to:: Private residence Living Arrangements: Children Available Help at Discharge: Family;Available 24 hours/day Type of Home: House Home Access: Level entry     Home Layout: One level     Bathroom Shower/Tub: Occupational psychologist: Standard                Prior Functioning/Environment Prior Level of Function : Working/employed;Independent/Modified Independent;Driving             Mobility Comments: worked 2 jobs from home ADLs Comments: indep        OT Problem List: Decreased strength;Decreased range of motion;Impaired balance (sitting and/or standing);Decreased coordination      OT Treatment/Interventions:      OT Goals(Current goals can be found in the care plan section) Acute Rehab OT Goals Patient Stated Goal: back to walking without AD OT Goal Formulation: All assessment and education complete, DC therapy Time For  Goal Achievement: 08/11/21  OT Frequency:      AM-PAC OT "6 Clicks" Daily Activity     Outcome Measure Help from another person eating meals?: A Little Help from another person taking care of personal grooming?: A Little Help from another person toileting, which includes using toliet, bedpan, or urinal?: A Little Help from another person bathing (including washing, rinsing, drying)?: A Little Help from another person to put on and taking off regular upper body clothing?: None Help from another person to put on and taking off regular lower body clothing?: A Little 6 Click Score: 19   End of Session Equipment Utilized During Treatment: Gait belt;Rolling walker (2 wheels) Nurse Communication: Mobility status  Activity Tolerance: Patient tolerated treatment well Patient left: in chair;with call bell/phone within reach  OT Visit Diagnosis: Unsteadiness on feet (R26.81);Muscle weakness (generalized) (M62.81);Hemiplegia and hemiparesis Hemiplegia - Right/Left: Right Hemiplegia - dominant/non-dominant: Dominant Hemiplegia - caused by: Cerebral infarction                Time: 1116-1130 OT Time Calculation (min): 14 min Charges:  OT General Charges $OT Visit: 1 Visit OT Evaluation $OT Eval Low Complexity: 1 Low   Elige Shouse A Adelene Polivka 08/11/2021, 12:03 PM

## 2021-08-11 NOTE — Discharge Summary (Signed)
Physician Discharge Summary   Patient: Marquia Costello MRN: [id number] DOB: 16-Jan-1961  Admit date:     08/10/2021  Discharge date: 08/11/21  Discharge Physician: Patrecia Pour   PCP: Pcp, No   Recommendations at discharge:  Tobacco cessation counseling Outpatient PT DAPT for 3 weeks, then aspirin monotherapy, continue statin. Follow up with neurology for post stroke care. Continue chronic medical disease management by PCP.  Discharge Diagnoses: Principal Problem:   CVA (cerebral vascular accident) [facility]) Active Problems:   Essential (primary) hypertension   Hyperlipidemia   Type 2 diabetes mellitus ([place])   Tobacco abuse   Rash  Hospital Course: Janiah Devinney is a 61 y.o. female with a history of T2DM, HTN, HLD, tobacco use who presented to the ED 2/19 with right leg abnormal sensation and heaviness, found to have MRI brain evidence of stroke. There was 1.5cm acute ischemic left paramedian pontine infarct with additional punctate acute-subacute infarct. MRA revealed occlusion of right ICA with distal reconstitution, suspected to be chronic. Neurology was consulted and the patient was admitted for stroke work up. See below for details.  Assessment and Plan: Acute left paramedian pontine infarct:  - Echo with no IAS, normal function. No AFib on telemetry during hospitalization. ECG NSR w/vent rate 82bpm at admission. - DAPT x3 weeks, then aspirin. Restart statin as LDL is 121. - Outpatient PT recommended - Neuro f/u recommended.  Chronic right ICA occlusion: Left without significant stenosis.  Tobacco use:  - Cessation counseling strongly advised  T2DM: HbA1c 6.4%.  - Could consider restarting metformin, though A1c is at goal.  HTN:  - PCP follow up recommended. With ICA occlusion, would avoid abrupt BP lowering.  HLD:  - Statin as above  Rash: Not requiring hospitalization, recommend outpatient dermatology follow up.   Consultants: Neurology Procedures performed:  None  Disposition: Home Diet recommendation:  Cardiac and Carb modified diet  DISCHARGE MEDICATION: Allergies as of 08/11/2021   No Known Allergies      Medication List     STOP taking these medications    insulin glargine 100 UNIT/ML Solostar Pen Commonly known as: Lantus SoloStar       TAKE these medications    aspirin 81 MG tablet Take 1 tablet (81 mg total) by mouth daily.   atorvastatin 40 MG tablet Commonly known as: LIPITOR Take 1 tablet (40 mg total) by mouth daily.   clopidogrel 75 MG tablet Commonly known as: PLAVIX Take 1 tablet (75 mg total) by mouth daily for 21 days. Start taking on: August 12, 2021   Insulin Pen Needle 31G X 8 MM Misc 1 each by Does not apply route as directed.   nicotine 21 mg/24hr patch Commonly known as: NICODERM CQ - dosed in mg/24 hours Place 1 patch (21 mg total) onto the skin daily. Start taking on: August 12, 2021   ONE TOUCH ULTRA SYSTEM KIT w/Device Kit 1 kit by Does not apply route once. May dispense generic as covered by insurance               Durable Medical Equipment  (From admission, onward)           Start     Ordered   08/11/21 1143  For home use only DME Walker rolling  Once       Question Answer Comment  Walker: With [place]   Patient needs a walker to treat with the following condition Stroke ([place])      08/11/21 1144  Follow-up Information     [facility]. Schedule an appointment as soon as possible for a visit in 2 month(s).   Contact information: [place] [phone] [place] Follow up on 08/15/2021.   Specialty: Internal Medicine Why: Your appointment is at 10 am. Please arrive early and bring a picture ID, current medications and your insurance card Contact information: [place] [address] [place]. Schedule an appointment as soon as possible for a visit in 1 week(s).   Specialty: Rehabilitation Contact information: [address] [place] [id number] [place] [phone]               Subjective: Feels well, still with some abnormal sensation to right leg mainly. Feels steady on her feet but notes she has an abnormal gait, carries right foot widely around to step. No speech changes, or other new deficits.   Discharge Exam: BP (!) 148/97 (BP Location: Right Arm)    Pulse 74    Temp 98.2 F (36.8 C) (Oral)    Resp 16    Ht 5' 6"  (1.676 m)    Wt 85.7 kg    LMP  (LMP Unknown)    SpO2 100%    BMI 30.51 kg/m   No distress, well-appearing Clear, nonlabored RRR no MRG Soft, NT ND Alert, oriented, abnormal sensation and coordination in right LE with intact strength throughout.  Condition at discharge: stable  The results of significant diagnostics from this hospitalization (including imaging, microbiology, ancillary and laboratory) are listed below for reference.   Imaging Studies: MR ANGIO HEAD WO CONTRAST  Result Date: 08/11/2021 CLINICAL DATA:  61 year old female code stroke presentation. Left greater than right pontine infarcts on MRI yesterday. EXAM: MRA HEAD WITHOUT CONTRAST TECHNIQUE: Angiographic images of the Circle of Willis were acquired using MRA technique without intravenous contrast. COMPARISON:  Brain MRI 08/10/2021. FINDINGS: Anterior circulation: Absent flow in the right ICA until reconstitution in the region of the right posterior communicating artery. Contralateral left ICA siphon with antegrade flow signal but moderate siphon irregularity in keeping with atherosclerosis. Atherosclerotic pseudo lesion of the anterior genu suspected. No significant left siphon stenosis. Patent left carotid terminus. Bilateral MCA and ACA origins remain patent. Anterior communicating artery is visible.  Visible bilateral ACA branches are within normal limits. Left MCA M1 segment is tortuous with patent trifurcation. Left MCA branches are within normal limits. Contralateral right MCA flow signal is relatively symmetric. Right MCA bifurcation is patent without stenosis. Visible right MCA branches are within normal limits. Posterior circulation: Antegrade flow in the posterior circulation with dominant right vertebral V4 segment. Normal PICA origins. No distal vertebral stenosis. Patent vertebrobasilar junction and basilar artery without stenosis. No significant basilar artery irregularity. SCA and PCA origins are normal. Tortuous left P1 segment. Small posterior communicating arteries. Bilateral PCA branches are within normal limits. Anatomic variants: Dominant right vertebral artery. Other: Rightward gaze deviation. IMPRESSION: 1. Occluded Right ICA, likely chronic given absence of acute signal changes in the right anterior circulation yesterday. Right ICA terminus is reconstituted in part by the right Pcomm. Right ACA and MCA branches appear within normal limits. 2. Negative posterior circulation.  Dominant right vertebral artery. 3. Atherosclerotic left ICA siphon, but no significant left siphon stenosis. Electronically Signed   By: Lemmie Evens  [place] Crane M.D.   On: 08/11/2021 04:53   MR BRAIN WO CONTRAST  Result Date: 08/10/2021 CLINICAL DATA:  Initial evaluation for neuro deficit, stroke suspected. EXAM: MRI HEAD WITHOUT CONTRAST TECHNIQUE: Multiplanar, multiecho pulse sequences of the brain and surrounding structures were obtained without intravenous contrast. COMPARISON:  Head CT from earlier the same day. FINDINGS: Brain: Cerebral volume within normal limits for age. Scattered patchy T2/FLAIR hyperintensity involving the deep and subcortical white matter both cerebral hemispheres, nonspecific, but most like related chronic microvascular ischemic disease, mild to moderate in nature. Small remote lacunar infarct noted at  the right periatrial white matter. Small remote lacunar infarct noted at the anterior right caudate. Few additional remote lacunar infarcts present about the pons. Acute ischemic infarct involving the left paramedian pons measures 1.5 x 0.9 cm (series 5, image 61). An additional punctate acute to early subacute ischemic infarct present within the adjacent right pons as well (series 5, image 63). No associated hemorrhage or mass effect. No other evidence for acute or subacute ischemia elsewhere. Gray-white matter differentiation maintained. No areas of chronic cortical infarction. Few additional chronic micro hemorrhages noted about the deep gray nuclei, likely hypertensive in nature. No mass lesion, midline shift or mass effect. No hydrocephalus or extra-axial fluid collection. Pituitary gland suprasellar region normal. Midline structures intact. Vascular: Abnormal flow void within the right ICA to the terminus, likely occluded. Major intracranial vascular flow voids are otherwise maintained. Skull and upper cervical spine: Craniocervical junction within normal limits. Bone marrow signal intensity diffusely decreased on T1 weighted sequence, nonspecific, but most commonly related to anemia, smoking or obesity. No focal marrow replacing lesion. No scalp soft tissue abnormality. Sinuses/Orbits: Right gaze noted. Paranasal sinuses are largely clear. No significant mastoid effusion. Other: None. IMPRESSION: 1. 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 2. Additional punctate acute to early subacute ischemic nonhemorrhagic right pontine infarct. 3. Underlying mild to moderate chronic microvascular ischemic disease. Electronically Signed   By: Jeannine Boga M.D.   On: 08/10/2021 21:32   DG CHEST PORT 1 VIEW  Result Date: 08/10/2021 CLINICAL DATA:  History of stroke, initial encounter EXAM: PORTABLE CHEST 1 VIEW COMPARISON:  07/08/2011 FINDINGS: The heart size and mediastinal contours  are within normal limits. Both lungs are clear. The visualized skeletal structures are unremarkable. IMPRESSION: No active disease. Electronically Signed   By: Inez Catalina M.D.   On: 08/10/2021 23:11   ECHOCARDIOGRAM COMPLETE  Result Date: 08/11/2021    ECHOCARDIOGRAM REPORT   Patient Name:   EMERI ESTILL Date of Exam: 08/11/2021 Medical Rec #:  [id number]       Height:       66.0 in Accession #:    [id number]      Weight:       189.0 lb Date of Birth:  April 17, 1961       BSA:          1.952 m Patient Age:    61 years        BP:           148/88 mmHg Patient Gender: F               HR:           81 bpm. Exam Location:  Inpatient Procedure: 2D Echo, Color Doppler and Cardiac Doppler Indications:    Stroke i63.9  History:        Patient has no prior history of Echocardiogram examinations.  Risk Factors:Hypertension, Diabetes and Dyslipidemia.  Sonographer:    Raquel Sarna Senior RDCS Referring Phys: [place]  1. Left ventricular ejection fraction, by estimation, is 55 to 60%. The left ventricle has normal function. The left ventricle has no regional wall motion abnormalities. Left ventricular diastolic parameters were normal.  2. Right ventricular systolic function is normal. The right ventricular size is normal.  3. The mitral valve is normal in structure. Trivial mitral valve regurgitation. No evidence of mitral stenosis.  4. The aortic valve is normal in structure. Aortic valve regurgitation is not visualized. No aortic stenosis is present. FINDINGS  Left Ventricle: Left ventricular ejection fraction, by estimation, is 55 to 60%. The left ventricle has normal function. The left ventricle has no regional wall motion abnormalities. The left ventricular internal cavity size was normal in size. There is  borderline left ventricular hypertrophy. Left ventricular diastolic parameters were normal. Right Ventricle: The right ventricular size is normal. Right vetricular wall thickness  was not well visualized. Right ventricular systolic function is normal. Left Atrium: Left atrial size was normal in size. Right Atrium: Right atrial size was normal in size. Pericardium: There is no evidence of pericardial effusion. Mitral Valve: The mitral valve is normal in structure. Trivial mitral valve regurgitation. No evidence of mitral valve stenosis. Tricuspid Valve: The tricuspid valve is normal in structure. Tricuspid valve regurgitation is not demonstrated. Aortic Valve: The aortic valve is normal in structure. Aortic valve regurgitation is not visualized. No aortic stenosis is present. Pulmonic Valve: The pulmonic valve was normal in structure. Pulmonic valve regurgitation is not visualized. Aorta: The aortic root and ascending aorta are structurally normal, with no evidence of dilitation. IAS/Shunts: The atrial septum is grossly normal.  LEFT VENTRICLE PLAX 2D LVIDd:         4.10 cm   Diastology LVIDs:         3.00 cm   LV e' medial:    9.14 cm/s LV PW:         1.10 cm   LV E/e' medial:  9.9 LV IVS:        1.00 cm   LV e' lateral:   7.51 cm/s LVOT diam:     2.00 cm   LV E/e' lateral: 12.0 LV SV:         60 LV SV Index:   31 LVOT Area:     3.14 cm  RIGHT VENTRICLE RV S prime:     11.30 cm/s TAPSE (M-mode): 1.7 cm LEFT ATRIUM             Index        RIGHT ATRIUM           Index LA diam:        3.10 cm 1.59 cm/m   RA Area:     12.20 cm LA Vol (A2C):   37.3 ml 19.11 ml/m  RA Volume:   27.80 ml  14.24 ml/m LA Vol (A4C):   42.0 ml 21.51 ml/m LA Biplane Vol: 41.2 ml 21.10 ml/m  AORTIC VALVE LVOT Vmax:   88.80 cm/s LVOT Vmean:  66.500 cm/s LVOT VTI:    0.190 m  AORTA Ao Root diam: 2.80 cm Ao Asc diam:  2.50 cm MITRAL VALVE MV Area (PHT): 4.08 cm    SHUNTS MV Decel Time: 186 msec    Systemic VTI:  0.19 m MV E velocity: 90.40 cm/s  Systemic Diam: 2.00 cm MV A velocity: 85.30 cm/s MV E/A ratio:  1.06 Mertie Moores MD Electronically signed by Mertie Moores MD Signature Date/Time: 08/11/2021/1:11:26 PM     Final    CT HEAD CODE STROKE WO CONTRAST  Result Date: 08/10/2021 CLINICAL DATA:  Code stroke. Neuro deficit, acute, stroke suspected. EXAM: CT HEAD WITHOUT CONTRAST TECHNIQUE: Contiguous axial images were obtained from the base of the skull through the vertex without intravenous contrast. RADIATION DOSE REDUCTION: This exam was performed according to the departmental dose-optimization program which includes automated exposure control, adjustment of the mA and/or kV according to patient size and/or use of iterative reconstruction technique. COMPARISON:  07/08/2011 FINDINGS: Brain: There are chronic small-vessel ischemic changes of the pons, presumed old. No focal cerebellar finding. Cerebral hemispheres show minimal small vessel change of the white matter. No cortical or large vessel territory infarction. No mass lesion, hemorrhage, hydrocephalus or extra-axial collection. Vascular: There is atherosclerotic calcification of the major vessels at the base of the brain. Skull: Negative Sinuses/Orbits: Clear/normal Other: None ASPECTS ([place] Stroke Program Early CT Score) - Ganglionic level infarction (caudate, lentiform nuclei, internal capsule, insula, M1-M3 cortex): 7 - Supraganglionic infarction (M4-M6 cortex): 3 Total score (0-10 with 10 being normal): 10 IMPRESSION: 1. No acute CT finding. Old appearing small vessel infarctions in the pons. Mild chronic small-vessel change of the cerebral hemispheric white matter. 2. ASPECTS is 10 3. These results were called by telephone at the time of interpretation on 08/10/2021 at 7:53 pm to provider [facility] , who verbally acknowledged these results. Electronically Signed   By: Nelson Chimes M.D.   On: 08/10/2021 19:54   VAS US CAROTID (at [facility] and [facility] only)  Result Date: 08/11/2021 Carotid Arterial Duplex Study Patient Name:  LEDONNA DORMER  Date of Exam:   08/11/2021 Medical Rec #: [id number]        Accession #:    [id number] Date of Birth: 31-Jul-1960        Patient  Gender: F Patient Age:   3 years Exam Location:  [facility] Procedure:      VAS US CAROTID Referring Phys: Nyoka Lint DOUTOVA --------------------------------------------------------------------------------  Indications:       CVA. Risk Factors:      Hypertension, hyperlipidemia, Diabetes, current smoker. Comparison Study:  08-11-2021 MRA Head showed occluded Right ICA, likely chronic                    given absence of acute signal changes in the right anterior                    circulation yesterday. Right ICA terminus is reconstituted in                    part by the right Pcomm Performing Technologist: Darlin Coco RDMS, RVT  Examination Guidelines: A complete evaluation includes B-mode imaging, spectral Doppler, color Doppler, and power Doppler as needed of all accessible portions of each vessel. Bilateral testing is considered an integral part of a complete examination. Limited examinations for reoccurring indications may be performed as noted.  Right Carotid Findings: +----------+--------+--------+--------+--------------------+-------------------+             PSV cm/s EDV cm/s Stenosis Plaque Description   Comments             +----------+--------+--------+--------+--------------------+-------------------+  CCA Prox   75       8                                                           +----------+--------+--------+--------+--------------------+-------------------+  CCA Distal 56       11                                                          +----------+--------+--------+--------+--------------------+-------------------+  ICA Prox   33       6        Occluded hypoechoic and       Occluded just after                                         heterogenous         bifurcation          +----------+--------+--------+--------+--------------------+-------------------+  ICA Mid    0                 Occluded                                            +----------+--------+--------+--------+--------------------+-------------------+  ICA Distal 0                 Occluded                                           +----------+--------+--------+--------+--------------------+-------------------+  ECA        105      19                                                          +----------+--------+--------+--------+--------------------+-------------------+ +----------+--------+-------+----------------+-------------------+             PSV cm/s EDV cms Describe         Arm Pressure (mmHG)  +----------+--------+-------+----------------+-------------------+  Subclavian 116              Multiphasic, WNL                      +----------+--------+-------+----------------+-------------------+ +---------+--------+--+--------+--+---------+  Vertebral PSV cm/s 64 EDV cm/s 23 Antegrade  +---------+--------+--+--------+--+---------+  Left Carotid Findings: +----------+--------+--------+--------+---------------------------+--------+             PSV cm/s EDV cm/s Stenosis Plaque Description          Comments  +----------+--------+--------+--------+---------------------------+--------+  CCA Prox   78       23                                                      +----------+--------+--------+--------+---------------------------+--------+  CCA Distal 88       26                                                      +----------+--------+--------+--------+---------------------------+--------+  ICA Prox   56       19                heterogenous and hypoechoic           +----------+--------+--------+--------+---------------------------+--------+  ICA Distal 111      42                                                      +----------+--------+--------+--------+---------------------------+--------+  ECA        72       11                                                      +----------+--------+--------+--------+---------------------------+--------+  +----------+--------+--------+----------------+-------------------+             PSV cm/s EDV cm/s Describe         Arm Pressure (mmHG)  +----------+--------+--------+----------------+-------------------+  Subclavian 81                Multiphasic, WNL                      +----------+--------+--------+----------------+-------------------+ +---------+--------+--+--------+--+---------+  Vertebral PSV cm/s 59 EDV cm/s 12 Antegrade  +---------+--------+--+--------+--+---------+   Summary: Right Carotid: Evidence consistent with a total occlusion of the right ICA. Left Carotid: Velocities in the left ICA are consistent with a 1-39% stenosis. Vertebrals:  Bilateral vertebral arteries demonstrate antegrade flow. Subclavians: Normal flow hemodynamics were seen in bilateral subclavian              arteries. *See table(s) above for measurements and observations.     Preliminary     Microbiology: Results for orders placed or performed during the hospital encounter of 08/10/21  Resp Panel by RT-PCR (Flu A&B, Covid) Nasopharyngeal Swab     Status: None   Collection Time: 08/10/21  7:32 PM   Specimen: Nasopharyngeal Swab; Nasopharyngeal(NP) swabs in vial transport medium  Result Value Ref Range Status   SARS Coronavirus 2 by RT PCR NEGATIVE NEGATIVE Final    Comment: (NOTE) SARS-CoV-2 target nucleic acids are NOT DETECTED.  The SARS-CoV-2 RNA is generally detectable in upper respiratory specimens during the acute phase of infection. The lowest concentration of SARS-CoV-2 viral copies this assay can detect is 138 copies/mL. A negative result does not preclude SARS-Cov-2 infection and should not be used as the sole basis for treatment or other patient management decisions. A negative result may occur with  improper specimen collection/handling, submission of specimen other than nasopharyngeal swab, presence of viral mutation(s) within the areas targeted by this assay, and inadequate number of viral copies(<138  copies/mL). A negative result must be combined with clinical observations, patient history, and epidemiological information. The expected result is Negative.  Fact Sheet for Patients:  EntrepreneurPulse.com.au  Fact Sheet for Healthcare Providers:  IncredibleEmployment.be  This test is no t yet approved or cleared by the [place] FDA and  has been authorized for detection and/or diagnosis of SARS-CoV-2 by FDA under an Emergency Use Authorization (EUA). This EUA will remain  in effect (meaning this test can be used) for the duration of the COVID-19 declaration under Section 564(b)(1) of the Act, 21  U.S.C.section 360bbb-3(b)(1), unless the authorization is terminated  or revoked sooner.       Influenza A by PCR NEGATIVE NEGATIVE Final   Influenza B by PCR NEGATIVE NEGATIVE Final    Comment: (NOTE) The Xpert Xpress SARS-CoV-2/FLU/RSV plus assay is intended as an aid in the diagnosis of influenza from Nasopharyngeal swab specimens and should not be used as a sole basis for treatment. Nasal washings and aspirates are unacceptable for Xpert Xpress SARS-CoV-2/FLU/RSV testing.  Fact Sheet for Patients: EntrepreneurPulse.com.au  Fact Sheet for Healthcare Providers: IncredibleEmployment.be  This test is not yet approved or cleared by the [place] FDA and has been authorized for detection and/or diagnosis of SARS-CoV-2 by FDA under an Emergency Use Authorization (EUA). This EUA will remain in effect (meaning this test can be used) for the duration of the COVID-19 declaration under Section 564(b)(1) of the Act, 21 U.S.C. section 360bbb-3(b)(1), unless the authorization is terminated or revoked.  Performed at [facility], [place] [address] [place], [place] [address]     Labs: CBC: Recent Labs  Lab 08/10/21 1637 08/11/21 0314  WBC 11.1*  --   NEUTROABS 7.7 6.5  HGB 14.7  --    HCT 45.3  --   MCV 88.1  --   PLT 220  --    Basic Metabolic Panel: Recent Labs  Lab 08/10/21 1637 08/11/21 0314  NA 137  --   K 3.9  --   CL 104  --   CO2 25  --   GLUCOSE 175*  --   BUN 11  --   CREATININE 0.93  --   CALCIUM 9.0  --   MG  --  2.2  PHOS  --  3.3   Liver Function Tests: Recent Labs  Lab 08/10/21 1637 08/11/21 0314  AST 14* 10*  ALT 9 8  ALKPHOS 71 67  BILITOT 0.4 0.5  PROT 7.9 7.0  ALBUMIN 3.9 3.3*   CBG: Recent Labs  Lab 08/11/21 0444 08/11/21 0821 08/11/21 1150  GLUCAP 159* 121* 132*    Discharge time spent: greater than 30 minutes.  Signed: Patrecia Pour, MD [facility] 08/11/2021

## 2021-08-11 NOTE — Progress Notes (Addendum)
STROKE TEAM PROGRESS NOTE   SUBJECTIVE (INTERVAL HISTORY) Alexandra Wilson is seen alone at the bedside.  Overall her condition is rapidly improving. Patient reports the acute onset of R leg weakness and numbness. Alexandra Wilson denies sx of the other side of her body. This morning, Alexandra Wilson reports that there is still some "heaviness" in her RLE.   Alexandra Wilson reports medication noncompliance and smoking 1 ppd.   CTH with no acute changes. MRI with 1.5 cm acute ischemic nonhemorrhagic left paramedian pontine infarct and an additional punctate acute to early subacute ischemic nonhemorrhagic right pontine infarct.   OBJECTIVE CBC:  Recent Labs  Lab 08/10/21 1637 08/11/21 0314  WBC 11.1*  --   NEUTROABS 7.7 6.5  HGB 14.7  --   HCT 45.3  --   MCV 88.1  --   PLT 220  --    Basic Metabolic Panel:  Recent Labs  Lab 08/10/21 1637 08/11/21 0314  NA 137  --   K 3.9  --   CL 104  --   CO2 25  --   GLUCOSE 175*  --   BUN 11  --   CREATININE 0.93  --   CALCIUM 9.0  --   MG  --  2.2  PHOS  --  3.3   Lipid Panel:  Recent Labs  Lab 08/11/21 0314  CHOL 187  TRIG 61  HDL 54  CHOLHDL 3.5  VLDL 12  LDLCALC 121*   HgbA1c:  Recent Labs  Lab 08/11/21 0314  HGBA1C 6.4*   Urine Drug Screen:  Recent Labs  Lab 08/10/21 2016  LABOPIA NONE DETECTED  COCAINSCRNUR NONE DETECTED  LABBENZ NONE DETECTED  AMPHETMU NONE DETECTED  THCU NONE DETECTED  LABBARB NONE DETECTED    Alcohol Level  Recent Labs  Lab 08/10/21 1934  ETH <10    IMAGING past 24 hours MR ANGIO HEAD WO CONTRAST  Result Date: 08/11/2021 CLINICAL DATA:  61 year old female code stroke presentation. Left greater than right pontine infarcts on MRI yesterday. EXAM: MRA HEAD WITHOUT CONTRAST TECHNIQUE: Angiographic images of the Circle of Willis were acquired using MRA technique without intravenous contrast. COMPARISON:  Brain MRI 08/10/2021. FINDINGS: Anterior circulation: Absent flow in the right ICA until reconstitution in the region of the right  posterior communicating artery. Contralateral left ICA siphon with antegrade flow signal but moderate siphon irregularity in keeping with atherosclerosis. Atherosclerotic pseudo lesion of the anterior genu suspected. No significant left siphon stenosis. Patent left carotid terminus. Bilateral MCA and ACA origins remain patent. Anterior communicating artery is visible. Visible bilateral ACA branches are within normal limits. Left MCA M1 segment is tortuous with patent trifurcation. Left MCA branches are within normal limits. Contralateral right MCA flow signal is relatively symmetric. Right MCA bifurcation is patent without stenosis. Visible right MCA branches are within normal limits. Posterior circulation: Antegrade flow in the posterior circulation with dominant right vertebral V4 segment. Normal PICA origins. No distal vertebral stenosis. Patent vertebrobasilar junction and basilar artery without stenosis. No significant basilar artery irregularity. SCA and PCA origins are normal. Tortuous left P1 segment. Small posterior communicating arteries. Bilateral PCA branches are within normal limits. Anatomic variants: Dominant right vertebral artery. Other: Rightward gaze deviation. IMPRESSION: 1. Occluded Right ICA, likely chronic given absence of acute signal changes in the right anterior circulation yesterday. Right ICA terminus is reconstituted in part by the right Pcomm. Right ACA and MCA branches appear within normal limits. 2. Negative posterior circulation.  Dominant right vertebral artery. 3. Atherosclerotic left  ICA siphon, but no significant left siphon stenosis. Electronically Signed   By: Genevie Ann M.D.   On: 08/11/2021 04:53   MR BRAIN WO CONTRAST  Result Date: 08/10/2021 CLINICAL DATA:  Initial evaluation for neuro deficit, stroke suspected. EXAM: MRI HEAD WITHOUT CONTRAST TECHNIQUE: Multiplanar, multiecho pulse sequences of the brain and surrounding structures were obtained without intravenous  contrast. COMPARISON:  Head CT from earlier the same day. FINDINGS: Brain: Cerebral volume within normal limits for age. Scattered patchy T2/FLAIR hyperintensity involving the deep and subcortical white matter both cerebral hemispheres, nonspecific, but most like related chronic microvascular ischemic disease, mild to moderate in nature. Small remote lacunar infarct noted at the right periatrial white matter. Small remote lacunar infarct noted at the anterior right caudate. Few additional remote lacunar infarcts present about the pons. Acute ischemic infarct involving the left paramedian pons measures 1.5 x 0.9 cm (series 5, image 61). An additional punctate acute to early subacute ischemic infarct present within the adjacent right pons as well (series 5, image 63). No associated hemorrhage or mass effect. No other evidence for acute or subacute ischemia elsewhere. Gray-white matter differentiation maintained. No areas of chronic cortical infarction. Few additional chronic micro hemorrhages noted about the deep gray nuclei, likely hypertensive in nature. No mass lesion, midline shift or mass effect. No hydrocephalus or extra-axial fluid collection. Pituitary gland suprasellar region normal. Midline structures intact. Vascular: Abnormal flow void within the right ICA to the terminus, likely occluded. Major intracranial vascular flow voids are otherwise maintained. Skull and upper cervical spine: Craniocervical junction within normal limits. Bone marrow signal intensity diffusely decreased on T1 weighted sequence, nonspecific, but most commonly related to anemia, smoking or obesity. No focal marrow replacing lesion. No scalp soft tissue abnormality. Sinuses/Orbits: Right gaze noted. Paranasal sinuses are largely clear. No significant mastoid effusion. Other: None. IMPRESSION: 1. 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 2. Additional punctate acute to early subacute ischemic nonhemorrhagic  right pontine infarct. 3. Underlying mild to moderate chronic microvascular ischemic disease. Electronically Signed   By: Jeannine Boga M.D.   On: 08/10/2021 21:32   DG CHEST PORT 1 VIEW  Result Date: 08/10/2021 CLINICAL DATA:  History of stroke, initial encounter EXAM: PORTABLE CHEST 1 VIEW COMPARISON:  07/08/2011 FINDINGS: The heart size and mediastinal contours are within normal limits. Both lungs are clear. The visualized skeletal structures are unremarkable. IMPRESSION: No active disease. Electronically Signed   By: Inez Catalina M.D.   On: 08/10/2021 23:11   CT HEAD CODE STROKE WO CONTRAST  Result Date: 08/10/2021 CLINICAL DATA:  Code stroke. Neuro deficit, acute, stroke suspected. EXAM: CT HEAD WITHOUT CONTRAST TECHNIQUE: Contiguous axial images were obtained from the base of the skull through the vertex without intravenous contrast. RADIATION DOSE REDUCTION: This exam was performed according to the departmental dose-optimization program which includes automated exposure control, adjustment of the mA and/or kV according to patient size and/or use of iterative reconstruction technique. COMPARISON:  07/08/2011 FINDINGS: Brain: There are chronic small-vessel ischemic changes of the pons, presumed old. No focal cerebellar finding. Cerebral hemispheres show minimal small vessel change of the white matter. No cortical or large vessel territory infarction. No mass lesion, hemorrhage, hydrocephalus or extra-axial collection. Vascular: There is atherosclerotic calcification of the major vessels at the base of the brain. Skull: Negative Sinuses/Orbits: Clear/normal Other: None ASPECTS ([place] Stroke Program Early CT Score) - Ganglionic level infarction (caudate, lentiform nuclei, internal capsule, insula, M1-M3 cortex): 7 - Supraganglionic infarction (M4-M6 cortex):  3 Total score (0-10 with 10 being normal): 10 IMPRESSION: 1. No acute CT finding. Old appearing small vessel infarctions in the pons. Mild  chronic small-vessel change of the cerebral hemispheric white matter. 2. ASPECTS is 10 3. These results were called by telephone at the time of interpretation on 08/10/2021 at 7:53 pm to provider [facility] , who verbally acknowledged these results. Electronically Signed   By: Nelson Chimes M.D.   On: 08/10/2021 19:54     PHYSICAL EXAM Temp:  [97.9 F (36.6 C)-98.7 F (37.1 C)] 98.7 F (37.1 C) (02/20 0818) Pulse Rate:  [71-93] 83 (02/20 0818) Resp:  [10-23] 16 (02/20 0818) BP: (136-176)/(72-102) 148/88 (02/20 0818) SpO2:  [98 %-100 %] 100 % (02/20 0818) Weight:  [85.7 kg] 85.7 kg (02/19 1606)  General - Well nourished, well developed, age congruent female in no apparent distress.  Cardiovascular - No peripheral edema  Mental Status -  Level of arousal and orientation to time, place, and person were intact. Language including expression, naming, repetition, comprehension was assessed and found intact. Attention span and concentration were normal. Recent and remote memory were intact. Fund of Knowledge was assessed and was intact.  Cranial Nerves II - XII - II - Visual field intact OU. III, IV, VI - Extraocular movements intact. V - Facial sensation intact bilaterally. VII - Facial movement intact bilaterally. VIII - Hearing & vestibular intact bilaterally. X - Palate elevates symmetrically. XI - Chin turning & shoulder shrug intact bilaterally. XII - Tongue protrusion intact.  Motor Strength - The patients strength was normal in all extremities and pronator drift was absent.  Bulk was normal and fasciculations were absent.   Motor Tone - Muscle tone was assessed at the neck and appendages and was normal.  Sensory - Light touch was assessed and symmetrical.    Coordination - The patient had normal movements in the hands and feet with no ataxia or dysmetria.  Tremor was absent.  Gait and Station - deferred.    ASSESSMENT/PLAN Alexandra Wilson is a 61 y.o. female with  history of DM2, HTN, HLD  presenting with RLE weakness.   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 Old small vessel infarcts in pons and mild chronic small-vessel change of the cerebral hemispheric white matter. ASPECTS 10.    MRI  1.5 cm acute ischemic nonhemorrhagic left paramedian pontine infarct. Additional punctate acute to early subacute ischemic nonhemorrhagic right pontine infarct. Underlying mild to moderate chronic microvascular ischemic disease. MRA  Occluded Right ICA, likely chronic given absence of acute signal changes in the right anterior circulation yesterday. Right ICA terminus is reconstituted in part by the right Pcomm. Right ACA and MCA branches appear within normal limits. Carotid Doppler  Total occlusion of R ICA 2D Echo EF 55 to 60% LDL 121 HgbA1c 6.4 VTE prophylaxis - SCDs aspirin 81 mg daily but non-compliant prior to admission, now on aspirin 81 mg daily and clopidogrel 75 mg daily DAPT for 3 weeks and then aspirin alone. Therapy recommendations:  Outpatient PT/OT Disposition:  Home  Hypertension Home meds: Not compliant with medication Stable Long-term BP goal normotensive  Hyperlipidemia Home meds:  Atorvastatin 40 mg, patient noncompliant, resumed in hospital LDL 121, goal < 70 Continue statin at discharge  Diabetes type II Controlled Home meds:  Lantus 25 u qAM, patient non-compliant HgbA1c 6.4, goal < 7.0 CBGs SSI Close PCP follow-up for DM control  Tobacco abuse  Current smoker Smoking cessation counseling provided Pt is willing to quit  Other Stroke Risk Factors Obesity, Body mass index is 30.51 kg/m., BMI >/= 30 associated with increased stroke risk, recommend weight loss, diet and exercise as appropriate  History of of stroke on imaging  Other [employer] [facility] day # 1  Rosezetta Schlatter, MD Stroke Neurology- Neuro Psych Resident 08/11/2021 8:26  AM  ATTENDING NOTE: I reviewed above note and agree with the assessment and plan. Pt was seen and examined.   61 year old female with history of hypertension, hyperlipidemia, diabetes, smoker admitted for right-sided leg numbness and weakness.  CT showed no acute abnormality but chronic bilateral old pontine infarcts.  MRI showed left pontine acute and right pontine subacute infarct.  MRI right ICA chronic occlusion, right terminal ICA reconstituted from right P-comm, right MCA and ACA patent.  Carotid Doppler confirmed total occluded right ICA.  EF 55 to 60%, LDL 121, A1c 6.4.  Creatinine 0.93.  On exam, patient neuro intact at this time.  Etiology of patient's stroke likely due to small vessel disease given risk factors.  Recommend aggressive risk factor modification including quit smoking, lifestyle changes.  Recommend aspirin and Plavix 75 DAPT for 3 weeks and then aspirin alone.  Continue Lipitor 40 on discharge.  PT/OT recommend outpatient PT/OT.  For detailed assessment and plan, please refer to above as I have made changes wherever appropriate.   Neurology will sign off. Please call with questions. Pt will follow up with stroke clinic NP at [facility] in about 4 weeks. Thanks for the consult.   Rosalin Hawking, MD PhD Stroke Neurology 08/11/2021 2:38 PM    To contact Stroke Continuity provider, please refer to [email or web address] After hours, contact General Neurology

## 2021-08-11 NOTE — Consult Note (Addendum)
NEUROLOGY CONSULTATION NOTE   Date of service: August 11, 2021 Patient Name: Alexandra Wilson MRN:  [id number] DOB:  1961/05/03 Reason for consult: "pontine stroke" Requesting Provider: Toy Baker, MD _ _ _   _ __   _ __ _ _  __ __   _ __   __ _  History of Present Illness  Alexandra Wilson is a 61 y.o. female with PMH significant for hx of DM2, HTN, HLD who presents with R leg numbness + heaviness. She was unable to feel the car brakes under her foot. MRI Brain demonstrated a 1.5cm L pontine ischemic stroke.  She was at work around 1300 when she felt like her R leg felt off. She attributed that to the heater running under her desk. She later got in her car to go to a grocery stroke and could not tell if she was pushing the as pedal or the brakes. She stopped and parked her car and called her son.  She smokes about 1 ppd, does not take medications for diabetes, HTN or HLD. She does not have a PCP. No prior stroke, no family hx of strokes, never been diagnosed with Afibb.  LKW: 1300 on 08/10/21. mRS: 0 tNKASE/Thrombectomy: outside the window for tNKASE, no LVO and not a candidate for thrombectomy. NIHSS components Score: Comment  1a Level of Conscious 0[x]  1[]  2[]  3[]      1b LOC Questions 0[x]  1[]  2[]       1c LOC Commands 0[x]  1[]  2[]       2 Best Gaze 0[x]  1[]  2[]       3 Visual 0[x]  1[]  2[]  3[]      4 Facial Palsy 0[x]  1[]  2[]  3[]      5a Motor Arm - left 0[x]  1[]  2[]  3[]  4[]  UN[]    5b Motor Arm - Right 0[x]  1[]  2[]  3[]  4[]  UN[]    6a Motor Leg - Left 0[x]  1[]  2[]  3[]  4[]  UN[]    6b Motor Leg - Right 0[x]  1[]  2[]  3[]  4[]  UN[]    7 Limb Ataxia 0[]  1[x]  2[]  3[]  UN[]     8 Sensory 0[]  1[x]  2[]  UN[]      9 Best Language 0[x]  1[]  2[]  3[]      10 Dysarthria 0[x]  1[]  2[]  UN[]      11 Extinct. and Inattention 0[x]  1[]  2[]       TOTAL: 2      ROS   Constitutional Denies weight loss, fever and chills.   HEENT Denies changes in vision and hearing.   Respiratory Denies SOB and cough.   CV  Denies palpitations and CP   GI Denies abdominal pain, nausea, vomiting and diarrhea.   GU Denies dysuria and urinary frequency.   MSK Denies myalgia and joint pain.   Skin Denies rash and pruritus.   Neurological Denies headache and syncope.   Psychiatric Denies recent changes in mood. Denies anxiety and depression.    Past History   Past Medical History:  Diagnosis Date   Diabetes mellitus type II 07/19/2011   Hyperlipidemia    Hypertension    Past Surgical History:  Procedure Laterality Date   CESAREAN SECTION     Family History  Problem Relation Age of Onset   Hypertension Mother    Cancer Sister    Diabetes Cousin        also maternal aunts   Colon cancer Neg Hx    Social History   Socioeconomic History   Marital status: Single    Spouse name: Not on file   Number  of children: Not on file   Years of education: Not on file   Highest education level: Not on file  Occupational History   Not on file  Tobacco Use   Smoking status: Every Day    Packs/day: 1.50    Years: 20.00    Pack years: 30.00    Types: Cigarettes   Smokeless tobacco: Never  Vaping Use   Vaping Use: Never used  Substance and Sexual Activity   Alcohol use: No   Drug use: No    Frequency: 3.0 times per week   Sexual activity: Never    Birth control/protection: Abstinence  Other Topics Concern   Not on file  Social History Narrative   Smokes 1 ppd. No alcohol or drug use. Works at an [employer]. Lives at home alone.    Social Determinants of Health   Financial Resource Strain: Not on file  Food Insecurity: Not on file  Transportation Needs: Not on file  Physical Activity: Not on file  Stress: Not on file  Social Connections: Not on file   No Known Allergies  Medications  (Not in a hospital admission)    Vitals   Vitals:   08/10/21 1955 08/10/21 2000 08/10/21 2015 08/10/21 2030  BP: (!) 176/102 (!) 157/83 (!) 170/93 (!) 151/95  Pulse: 80 87 81 86  Resp: 19 17 20  (!)  21  Temp:      TempSrc:      SpO2: 100% 98% 99% 100%  Weight:      Height:         Body mass index is 30.51 kg/m.  Physical Exam   General: Laying comfortably in bed; in no acute distress.  HENT: Normal oropharynx and mucosa. Normal external appearance of ears and nose.  Neck: Supple, no pain or tenderness  CV: No JVD. No peripheral edema.  Pulmonary: Symmetric Chest rise. Normal respiratory effort.  Abdomen: Soft to touch, non-tender.  Ext: No cyanosis, edema, or deformity  Skin: No rash. Normal palpation of skin.   Musculoskeletal: Normal digits and nails by inspection. No clubbing.   Neurologic Examination  Mental status/Cognition: Alert, oriented to self, place, month and year, good attention.  Speech/language: Fluent, comprehension intact, object naming intact, repetition intact.  Cranial nerves:   CN II Pupils equal and reactive to light, no VF deficits    CN III,IV,VI EOM intact, no gaze preference or deviation, no nystagmus    CN V normal sensation in V1, V2, and V3 segments bilaterally    CN VII no asymmetry, no nasolabial fold flattening    CN VIII normal hearing to speech    CN IX & X normal palatal elevation, no uvular deviation    CN XI 5/5 head turn and 5/5 shoulder shrug bilaterally    CN XII midline tongue protrusion    Motor:  Muscle bulk: normal, tone normal, pronator drift none tremor none Mvmt Root Nerve  Muscle Right Left Comments  SA C5/6 Ax Deltoid 5 5   EF C5/6 Mc Biceps 5 5   EE C6/7/8 Rad Triceps 5 5   WF C6/7 Med FCR     WE C7/8 PIN ECU     F Ab C8/T1 U ADM/FDI 5 5   HF L1/2/3 Fem Illopsoas 4+ 5   KE L2/3/4 Fem Quad 5 5   DF L4/5 D Peron Tib Ant 5 5   PF S1/2 Tibial Grc/Sol 5 5    Reflexes:  Right Left Comments  Pectoralis  Biceps (C5/6) 2 2   Brachioradialis (C5/6) 2 2    Triceps (C6/7) 2 2    Patellar (L3/4) 2 2    Achilles (S1)      Hoffman      Plantar     Jaw jerk    Sensation:  Light touch Mildly decreased in RLE.    Pin prick    Temperature    Vibration   Proprioception    Coordination/Complex Motor:  - Finger to Nose intact BL - Heel to shin ataxia in RLE - Rapid alternating movement are normal - Gait: Stride length short. Arm swing poor. Base width narrow.  Labs   CBC:  Recent Labs  Lab 08/10/21 1637  WBC 11.1*  NEUTROABS 7.7  HGB 14.7  HCT 45.3  MCV 88.1  PLT 454    Basic Metabolic Panel:  Lab Results  Component Value Date   NA 137 08/10/2021   K 3.9 08/10/2021   CO2 25 08/10/2021   GLUCOSE 175 (H) 08/10/2021   BUN 11 08/10/2021   CREATININE 0.93 08/10/2021   CALCIUM 9.0 08/10/2021   GFRNONAA >60 08/10/2021   GFRAA 108 02/01/2017   Lipid Panel:  Lab Results  Component Value Date   LDLCALC 140 (H) 02/01/2017   HgbA1c:  Lab Results  Component Value Date   HGBA1C 11.3 02/01/2017   Urine Drug Screen:     Component Value Date/Time   LABOPIA NONE DETECTED 08/10/2021 2016   COCAINSCRNUR NONE DETECTED 08/10/2021 2016   LABBENZ NONE DETECTED 08/10/2021 2016   AMPHETMU NONE DETECTED 08/10/2021 2016   THCU NONE DETECTED 08/10/2021 2016   LABBARB NONE DETECTED 08/10/2021 2016    Alcohol Level     Component Value Date/Time   ETH <10 08/10/2021 1934    CT Head without contrast(Personally reviewed): CTH was negative for a large hypodensity concerning for a large territory infarct or hyperdensity concerning for an ICH  MR Angio head without contrast and Carotid Duplex BL: pending  MRI Brain(Personally reviewed): L pontine stroke  Impression   Lajuanna Pompa is a 61 y.o. female with PMH significant for hx of DM2, HTN, HLD who presents with R leg numbness + heaviness. Found to have a acute L pontine infarct along with a small additional R pontine subacute infarct. Her neurologic examination is notable for ataxia in RLE along with R hi flexion weakness and mild numbness in RLE.  Primary Diagnosis:  Other cerebral infarction due to occlusion of stenosis of small  artery.  Secondary Diagnosis: Essential (primary) hypertension and Type 2 diabetes mellitus w/o complications  Recommendations  Plan:  - Frequent Neuro checks per stroke unit protocol - Recommend Vascular imaging with MRA Angio Head without contrast and US Carotid doppler - Recommend obtaining TTE - Recommend obtaining Lipid panel with LDL - Please start statin if LDL > 70 - Recommend HbA1c - Antithrombotic - Aspirin 81mg  daily along with plavix 75mg  daily for 21 days followed by Aspirin 81mg  daily alone. - Recommend DVT ppx - SBP goal - permissive hypertension first 24 h < 220/110. Held home meds.  - Recommend Telemetry monitoring for arrythmia - Recommend bedside swallow screen prior to PO intake. - Stroke education booklet - Recommend PT/OT/SLP consult - I counseled her extensively on the importance of quitting smoking and getting better control of comorbidities to reduce future risk of stroke.  _________________________________________________________________  Thank you for the opportunity to take part in the care of this patient. If you have any further questions, please  contact the neurology consultation attending.  Signed,  [place] Pager Number [phone] _ _ _   _ __   _ __ _ _  __ __   _ __   __ _

## 2021-08-11 NOTE — TOC Transition Note (Signed)
Transition of Care [facility]) - CM/SW Discharge Note   Patient Details  Name: Alexandra Wilson MRN: [id number] Date of Birth: 1961-06-11  [facility] CM/SW Contact:  Pollie Friar, RN Phone Number: 08/11/2021, 11:59 AM   Clinical Narrative:    Patient is discharging home with outpatient therapy through the [facility]. Orders in [place] and information on the AVS.  Walker for home to be delivered to the room per [employer]. Pt denies any issues with home medications or transportation. Pt without a PCP. She was interested in getting set up with one of the [facility]. Cm has placed appointment on the AVS.  Pt has transport home today.   Final next level of care: OP Rehab Barriers to Discharge: No Barriers Identified   Patient Goals and CMS Choice     Choice offered to / list presented to : Patient  Discharge Placement                       Discharge Plan and Services                DME Arranged: Walker rolling DME Agency: [employer] Date DME Agency Contacted: 08/11/21   Representative spoke with at DME Agency: [place] ([place] Interventions     Readmission Risk Interventions No flowsheet data found.

## 2021-08-11 NOTE — Progress Notes (Signed)
Echocardiogram 2D Echocardiogram has been performed.  Oneal Deputy Luxe Cuadros RDCS 08/11/2021, 10:38 AM

## 2021-08-15 ENCOUNTER — Ambulatory Visit (INDEPENDENT_AMBULATORY_CARE_PROVIDER_SITE_OTHER): Payer: 59 | Admitting: Nurse Practitioner

## 2021-08-15 ENCOUNTER — Encounter: Payer: Self-pay | Admitting: Nurse Practitioner

## 2021-08-15 ENCOUNTER — Other Ambulatory Visit: Payer: Self-pay

## 2021-08-15 VITALS — BP 154/89 | HR 92 | Temp 97.9°F | Ht 66.0 in | Wt 159.0 lb

## 2021-08-15 DIAGNOSIS — I6302 Cerebral infarction due to thrombosis of basilar artery: Secondary | ICD-10-CM

## 2021-08-15 DIAGNOSIS — Z0000 Encounter for general adult medical examination without abnormal findings: Secondary | ICD-10-CM | POA: Diagnosis not present

## 2021-08-15 DIAGNOSIS — E119 Type 2 diabetes mellitus without complications: Secondary | ICD-10-CM

## 2021-08-15 DIAGNOSIS — Z129 Encounter for screening for malignant neoplasm, site unspecified: Secondary | ICD-10-CM

## 2021-08-15 DIAGNOSIS — E7800 Pure hypercholesterolemia, unspecified: Secondary | ICD-10-CM | POA: Diagnosis not present

## 2021-08-15 DIAGNOSIS — I10 Essential (primary) hypertension: Secondary | ICD-10-CM

## 2021-08-15 DIAGNOSIS — Z794 Long term (current) use of insulin: Secondary | ICD-10-CM

## 2021-08-15 MED ORDER — METFORMIN HCL 500 MG PO TABS: 500.0000 mg | ORAL_TABLET | Freq: Two times a day (BID) | ORAL | 2 refills | Status: AC

## 2021-08-15 MED ORDER — LISINOPRIL 40 MG PO TABS
40.0000 mg | ORAL_TABLET | Freq: Every day | ORAL | 3 refills | Status: AC
Start: 2021-08-15 — End: ?

## 2021-08-15 MED ORDER — ATORVASTATIN CALCIUM 40 MG PO TABS: 40.0000 mg | ORAL_TABLET | Freq: Every day | ORAL | 2 refills | Status: DC

## 2021-08-15 NOTE — Progress Notes (Signed)
[address], [place]  [address] Phone:  [phone]   Fax:  [phone] Subjective:   Patient ID: Renell Coaxum, female    DOB: 07-Aug-1960, 61 y.o.   MRN: [id number]  Chief Complaint  Patient presents with   Establish Care    Pt is here to establish care. Pt is was in the hospital for stroke wanted to follow up.Pt would like a doctor note for work   HPI [facility] 61 y.o. female  has a past medical history of Diabetes mellitus type II (07/19/2011), Hyperlipidemia, and Hypertension. To the [facility] to establish care and hospital follow up.   Went to the hospital last week for worsening numbness in right leg, was admitted and treated for CVA. Admitted to the hospital for one day, currently uses walker to assist with ambulation. Compliant with all medications. Since leaving the hospital, only smokes 3 cigarettes per day, Nicotine patch is very effective. Generally eats healthy, but there is some room for improvement.  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 Resides with her son who helps her at home. Uses a walker to assist  with ambulation. Able to ambulate well without walker, but states that since stroke she is still concerned about falling. Denies any other complaints today.   Denies any fever. Denies any fatigue, chest pain, shortness of breath, HA or dizziness. Denies any blurred vision, numbness or tingling.  Past Medical History:  Diagnosis Date   Diabetes mellitus type II 07/19/2011   Hyperlipidemia    Hypertension     Past Surgical History:  Procedure Laterality Date   CESAREAN SECTION      Family History  Problem Relation Age of Onset   Hypertension Mother    Cancer Sister    Diabetes Cousin        also maternal aunts   Colon cancer Neg Hx     Social History   Socioeconomic History   Marital status: Single    Spouse name: Not on file   Number of children: Not on file   Years of education:  Not on file   Highest education level: Not on file  Occupational History   Not on file  Tobacco Use   Smoking status: Every Day    Packs/day: 0.25    Years: 20.00    Pack years: 5.00    Types: Cigarettes   Smokeless tobacco: Never  Vaping Use   Vaping Use: Never used  Substance and Sexual Activity   Alcohol use: No   Drug use: No    Frequency: 3.0 times per week   Sexual activity: Never    Birth control/protection: Abstinence  Other Topics Concern   Not on file  Social History Narrative   Smokes 1 ppd. No alcohol or drug use. Works at an [employer]. Lives at home alone.    Social Determinants of Health   Financial Resource Strain: Not on file  Food Insecurity: Not on file  Transportation Needs: Not on file  Physical Activity: Not on file  Stress: Not on file  Social Connections: Not on file  Intimate Partner Violence: Not on file    Outpatient Medications Prior to Visit  Medication Sig Dispense Refill   aspirin 81 MG tablet Take 1 tablet (81 mg total) by mouth daily. 30 tablet 1   Blood Glucose Monitoring Suppl (ONE TOUCH ULTRA SYSTEM KIT) W/DEVICE KIT 1 kit by Does not apply route once. May dispense generic  as covered by insurance 1 each 0   clopidogrel (PLAVIX) 75 MG tablet Take 1 tablet (75 mg total) by mouth daily for 21 days. 21 tablet 0   nicotine (NICODERM CQ - DOSED IN MG/24 HOURS) 21 mg/24hr patch Place 1 patch (21 mg total) onto the skin daily. 28 patch 0   atorvastatin (LIPITOR) 40 MG tablet Take 1 tablet (40 mg total) by mouth daily. 30 tablet 1   metFORMIN (GLUCOPHAGE) 500 MG tablet Take 500 mg by mouth 2 (two) times daily.     ASPIRIN LOW DOSE 81 MG EC tablet Take 81 mg by mouth daily.     Insulin Pen Needle 31G X 8 MM MISC 1 each by Does not apply route as directed. 100 each 3   No facility-administered medications prior to visit.    No Known Allergies  Review of Systems  Constitutional:  Negative for chills, fever and malaise/fatigue.  HENT:  Negative.    Eyes: Negative.   Respiratory:  Negative for cough and shortness of breath.   Cardiovascular:  Negative for chest pain, palpitations and leg swelling.  Gastrointestinal:  Negative for abdominal pain, blood in stool, constipation, diarrhea, nausea and vomiting.  Genitourinary: Negative.   Musculoskeletal:  Negative for back pain, falls, joint pain, myalgias and neck pain.       See HPI  Skin: Negative.   Neurological:  Positive for sensory change and focal weakness. Negative for dizziness, tingling, tremors, speech change, seizures, loss of consciousness, weakness and headaches.  Psychiatric/Behavioral:  Negative for depression. The patient is not nervous/anxious.   All other systems reviewed and are negative.     Objective:    Physical Exam Constitutional:      General: She is not in acute distress.    Appearance: Normal appearance.  HENT:     Head: Normocephalic.  Cardiovascular:     Rate and Rhythm: Normal rate and regular rhythm.     Pulses: Normal pulses.     Heart sounds: Normal heart sounds.     Comments: No obvious peripheral edema Pulmonary:     Effort: Pulmonary effort is normal.     Breath sounds: Normal breath sounds.  Skin:    General: Skin is warm and dry.     Capillary Refill: Capillary refill takes less than 2 seconds.  Neurological:     Mental Status: She is alert and oriented to person, place, and time.     Cranial Nerves: No cranial nerve deficit.     Sensory: Sensory deficit present.     Motor: Weakness present.     Coordination: Coordination normal.     Gait: Gait normal.     Deep Tendon Reflexes: Reflexes normal.     Comments: Reduced strength in RLE  Psychiatric:        Mood and Affect: Mood normal.        Behavior: Behavior normal.        Thought Content: Thought content normal.        Judgment: Judgment normal.    BP (!) 154/89    Pulse 92    Temp 97.9 F (36.6 C)    Ht _0  (1.676 m)    Wt 159 lb (72.1 kg)    LMP  (LMP Unknown)     SpO2 100%    BMI 25.66 kg/m  Wt Readings from Last 3 Encounters:  08/15/21 159 lb (72.1 kg)  08/10/21 189 lb (85.7 kg)  02/26/17 184 lb (83.5 kg)  Immunization History  Administered Date(s) Administered   Influenza,inj,Quad PF,6+ Mos 04/27/2013, 05/11/2014   Pneumococcal Polysaccharide-23 12/01/2011   Tdap 08/17/2011   Unspecified SARS-COV-2 Vaccination 12/06/2019, 01/10/2020    Diabetic Foot Exam - Simple   No data filed     Lab Results  Component Value Date   TSH 3.421 08/11/2021   Lab Results  Component Value Date   WBC 11.6 (H) 08/15/2021   HGB 14.4 08/15/2021   HCT 43.4 08/15/2021   MCV 88 08/15/2021   PLT 215 08/15/2021   Lab Results  Component Value Date   NA 140 08/15/2021   K 4.2 08/15/2021   CO2 26 08/15/2021   GLUCOSE 149 (H) 08/15/2021   BUN 9 08/15/2021   CREATININE 1.13 (H) 08/15/2021   BILITOT 0.4 08/15/2021   ALKPHOS 88 08/15/2021   AST 11 08/15/2021   ALT 6 08/15/2021   PROT 7.7 08/15/2021   ALBUMIN 4.7 08/15/2021   CALCIUM 9.9 08/15/2021   ANIONGAP 8 08/10/2021   EGFR 55 (L) 08/15/2021   Lab Results  Component Value Date   CHOL 187 08/11/2021   CHOL 210 (H) 02/01/2017   CHOL 181 05/11/2014   Lab Results  Component Value Date   HDL 54 08/11/2021   HDL 56 02/01/2017   HDL 48 05/11/2014   Lab Results  Component Value Date   LDLCALC 121 (H) 08/11/2021   LDLCALC 140 (H) 02/01/2017   LDLCALC 117 (H) 05/11/2014   Lab Results  Component Value Date   TRIG 61 08/11/2021   TRIG 68 02/01/2017   TRIG 81 05/11/2014   Lab Results  Component Value Date   CHOLHDL 3.5 08/11/2021   CHOLHDL 3.8 02/01/2017   CHOLHDL 3.8 05/11/2014   Lab Results  Component Value Date   HGBA1C 6.4 (H) 08/11/2021   HGBA1C 11.3 02/01/2017   HGBA1C 9.9 08/28/2015       Assessment & Plan:   Problem List Items Addressed This Visit       Cardiovascular and Mediastinum   Essential (primary) hypertension   Relevant Medications   ASPIRIN LOW DOSE  81 MG EC tablet   lisinopril (ZESTRIL) 40 MG tablet   atorvastatin (LIPITOR) 40 MG tablet   Other Relevant Orders   CBC with Differential/Platelet (Completed)   Comprehensive metabolic panel (Completed) Encouraged continued diet and exercise efforts  Encouraged continued compliance with medication     CVA (cerebral vascular accident) ([place])   Relevant Medications   ASPIRIN LOW DOSE 81 MG EC tablet   lisinopril (ZESTRIL) 40 MG tablet   atorvastatin (LIPITOR) 40 MG tablet Encouraged to maintain medication compliance Encouraged to maintain follow up appointments with specialist   Other Relevant Orders   Ambulatory referral to Neurology     Endocrine   Type 2 diabetes mellitus (HCC)   Relevant Medications   ASPIRIN LOW DOSE 81 MG EC tablet   lisinopril (ZESTRIL) 40 MG tablet   metFORMIN (GLUCOPHAGE) 500 MG tablet   atorvastatin (LIPITOR) 40 MG tablet   Other Relevant Orders   CBC with Differential/Platelet (Completed)   Comprehensive metabolic panel (Completed) Encouraged continued diet and exercise efforts  Encouraged continued compliance with medication       Other   Hyperlipidemia   Relevant Medications   ASPIRIN LOW DOSE 81 MG EC tablet   lisinopril (ZESTRIL) 40 MG tablet   atorvastatin (LIPITOR) 40 MG tablet   Other Visit Diagnoses     Encounter for wellness examination in adult    -  Primary   Relevant Orders   CBC with Differential/Platelet (Completed)   Comprehensive metabolic panel (Completed) Encouraged continued diet and exercise efforts  Encouraged continued compliance with medication     Screening for cancer       Relevant Orders   MM Digital Screening   Ambulatory referral to Gastroenterology   Follow up in 1 mth for  B/P check and reevaluation of lifestyle changes, sooner as needed    I have changed Skii Reuter's metFORMIN. I am also having her start on lisinopril. Additionally, I am having her maintain her [place] KIT, Insulin Pen  Needle, aspirin, nicotine, clopidogrel, Aspirin Low Dose, and atorvastatin.  Meds ordered this encounter  Medications   lisinopril (ZESTRIL) 40 MG tablet    Sig: Take 1 tablet (40 mg total) by mouth daily.    Dispense:  90 tablet    Refill:  3   metFORMIN (GLUCOPHAGE) 500 MG tablet    Sig: Take 1 tablet (500 mg total) by mouth 2 (two) times daily.    Dispense:  60 tablet    Refill:  2   atorvastatin (LIPITOR) 40 MG tablet    Sig: Take 1 tablet (40 mg total) by mouth daily.    Dispense:  30 tablet    Refill:  2     Teena Dunk, NP

## 2021-08-15 NOTE — Patient Instructions (Signed)
You were seen today in the [facility] for hospital follow up and to establish care. Labs were collected, results will be available via MyChart or, if abnormal, you will be contacted by clinic staff. You were prescribed medications, please take as directed. Please follow up in 1 mth for reevaluation.   Please check B/P at home regularly. Please maintain diet modifications.

## 2021-08-16 LAB — CBC WITH DIFFERENTIAL/PLATELET
Basophils Absolute: 0.1 10*3/uL (ref 0.0–0.2)
Basos: 1 %
EOS (ABSOLUTE): 0.5 10*3/uL — ABNORMAL HIGH (ref 0.0–0.4)
Eos: 4 %
Hematocrit: 43.4 % (ref 34.0–46.6)
Hemoglobin: 14.4 g/dL (ref 11.1–15.9)
Immature Grans (Abs): 0 10*3/uL (ref 0.0–0.1)
Immature Granulocytes: 0 %
Lymphocytes Absolute: 3.1 10*3/uL (ref 0.7–3.1)
Lymphs: 27 %
MCH: 29.1 pg (ref 26.6–33.0)
MCHC: 33.2 g/dL (ref 31.5–35.7)
MCV: 88 fL (ref 79–97)
Monocytes Absolute: 0.7 10*3/uL (ref 0.1–0.9)
Monocytes: 6 %
Neutrophils Absolute: 7.2 10*3/uL — ABNORMAL HIGH (ref 1.4–7.0)
Neutrophils: 62 %
Platelets: 215 10*3/uL (ref 150–450)
RBC: 4.94 x10E6/uL (ref 3.77–5.28)
RDW: 14.5 % (ref 11.7–15.4)
WBC: 11.6 10*3/uL — ABNORMAL HIGH (ref 3.4–10.8)

## 2021-08-16 LAB — COMPREHENSIVE METABOLIC PANEL
ALT: 6 IU/L (ref 0–32)
AST: 11 IU/L (ref 0–40)
Albumin/Globulin Ratio: 1.6 (ref 1.2–2.2)
Albumin: 4.7 g/dL (ref 3.8–4.8)
Alkaline Phosphatase: 88 IU/L (ref 44–121)
BUN/Creatinine Ratio: 8 — ABNORMAL LOW (ref 12–28)
BUN: 9 mg/dL (ref 8–27)
Bilirubin Total: 0.4 mg/dL (ref 0.0–1.2)
CO2: 26 mmol/L (ref 20–29)
Calcium: 9.9 mg/dL (ref 8.7–10.3)
Chloride: 102 mmol/L (ref 96–106)
Creatinine, Ser: 1.13 mg/dL — ABNORMAL HIGH (ref 0.57–1.00)
Globulin, Total: 3 g/dL (ref 1.5–4.5)
Glucose: 149 mg/dL — ABNORMAL HIGH (ref 70–99)
Potassium: 4.2 mmol/L (ref 3.5–5.2)
Sodium: 140 mmol/L (ref 134–144)
Total Protein: 7.7 g/dL (ref 6.0–8.5)
eGFR: 55 mL/min/{1.73_m2} — ABNORMAL LOW (ref >59)

## 2021-08-26 ENCOUNTER — Telehealth: Payer: Self-pay

## 2021-08-26 NOTE — Therapy (Signed)
?OUTPATIENT PHYSICAL THERAPY NEURO EVALUATION ? ? ?Patient Name: Alexandra Wilson ?MRN: [id number] ?DOB:02-04-1961, 61 y.o., female ?Today's Date: 08/27/2021 ? ?PCP: Teena Dunk, NP ?REFERRING PROVIDER: Bo Merino, NP ? ? PT End of Session - 08/27/21 1114   ? ? Visit Number 1   ? Number of Visits 1   ? PT Start Time 1015   ? PT Stop Time 1100   ? PT Time Calculation (min) 45 min   ? Equipment Utilized During Treatment Gait belt   ? Activity Tolerance Patient tolerated treatment well   ? Behavior During Therapy [facility] for tasks assessed/performed   ? ?  ?  ? ?  ? ? ?Past Medical History:  ?Diagnosis Date  ? Diabetes mellitus type II 07/19/2011  ? Hyperlipidemia   ? Hypertension   ? ?Past Surgical History:  ?Procedure Laterality Date  ? CESAREAN SECTION    ? ?Patient Active Problem List  ? Diagnosis Date Noted  ? CVA (cerebral vascular accident) ([place]) 08/10/2021  ? Rash 08/10/2021  ? History of abnormal Pap smear 08/21/2011  ? Plantar wart 08/17/2011  ? Type 2 diabetes mellitus ([place]) 07/19/2011  ? Tobacco abuse 07/19/2011  ? Hyperlipidemia 07/09/2011  ? Essential (primary) hypertension 07/08/2011  ? ? ?ONSET DATE: 08/10/21 ? ?REFERRING DIAG: I63.9 (ICD-10-CM) - Cerebrovascular accident (CVA), unspecified mechanism ([place])  ? ?THERAPY DIAG:  ?Other abnormalities of gait and mobility - Plan: PT plan of care cert/re-cert ? ?SUBJECTIVE:  ?                                                                                                                                                                                           ? ?SUBJECTIVE STATEMENT: ?Present Illness ?  ?Pt reports of noticing R sided numbness and weakness on 08/10/21. She came home on 08/01/21. She didn't have home therapy. She used the walker for the first week and then she progressed to no AD. No falls.  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 ?PERTINENT HISTORY: HTN DM2 HLD  ? ?PAIN:  ?Are you having pain? No ? ?PRECAUTIONS: None ? ?WEIGHT BEARING RESTRICTIONS No ? ?FALLS: Has patient fallen in last 6 months? No,  ? ?LIVING ENVIRONMENT: ?Lives with: lives with their son ?Lives in: House/apartment ?Stairs: No;  ?Has following equipment at home: Gilford Rile - 2 wheeled ? ?PLOF: Independent, Vocation/Vocational requirements: Working from home, computer work, and Leisure: watching TV ? ?PATIENT GOALS to be able to take a brisk walk if needed ? ?OBJECTIVE:  ? ?COGNITION: ?Overall cognitive status:  Within functional limits for tasks assessed ?  ? ? ?TRANSFERS: ?Assistive device utilized: None  ?Sit to stand: Complete Independence ?Stand to sit: Complete Independence ?Chair to chair: Complete Independence ?Floor: Complete Independence ? ?RAMP:  ?Level of Assistance: Complete Independence ?Assistive device utilized: None ? ? ?CURB:  ?Level of Assistance: Complete Independence ?Assistive device utilized: None ? ? ?STAIRS: ? Level of Assistance: Complete Independence ? Stair Negotiation Technique: Alternating Pattern  with Single Rail on Right ? Number of Stairs: 4  ? Height of Stairs: 6"  ? ? ?GAIT: ?Gait pattern: decreased arm swing- Right, decreased stance time- Right, and poor foot clearance- Right ?Distance walked: 1050' level ground, 400' on grass, 10' on gravel, 10' on mulch ?Assistive device utilized: None ?Level of assistance: Complete Independence ?Comments: Pt educated on using arm swings, looking down when walking on uneven grounds to improve visual cues, being aware of swinging arms with walking. Discussed walking program. ? ?FUNCTIONAL TESTs:  ?Functional gait assessment: 27/30 ? ? ? ?TODAY'S TREATMENT:  ?Gait training: 1 x 1050' on level ground, 400' on grass, 10' on gravel, 10' on mulch, cues given for arm swings, looking down 2 feet ahead when walking on uneven grounds, picking up R foot more ? ?  ? ? ?PATIENT EDUCATION: ?Education details:  Patient educated on measuring BP daily and keeping a diary to make sure BP meds are helping managing her BP, pt educated on discussing diet needs, medication changes regulary with PCP. Pt is sedentary at home without significant physical activities, pt educated on benefits of brisk walking up to 30 min a day. She is to follow walking program below where she starts with 6 min and gradually works up to 30 min per day of walking. ?Person educated: Patient ?Education method: Explanation ?Education comprehension: verbalized understanding ? ? ?HOME EXERCISE PROGRAM: ?Walking Program: ?Walk during cooler times of the day. ?Starting next week, ?Walk 6 min per day for 7 days. Following week add 2 minutes to your total time. ?Week 1 6 min ?Week 2: 8 min ?Week 3: 10 min ?Week 4: 12 min...Marland KitchenMarland KitchenUntil you can walk to 30 min per day ?  ? ?ASSESSMENT: ? ?CLINICAL IMPRESSION: ?Patient is a 61 y.o. 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 Patient demonstrates [facility] of gait and balance. Patient was given home exercise program to manage her symptoms. Patient does not require skilled PT at this time..  ? ? ?OBJECTIVE IMPAIRMENTS Abnormal gait.  ? ?ACTIVITY LIMITATIONS  none .  ? ?PERSONAL FACTORS 1 comorbidity: CVA  are also affecting patient's functional outcome.  ? ? ?REHAB POTENTIAL: Excellent ? ?CLINICAL DECISION MAKING: Stable/uncomplicated ? ?EVALUATION COMPLEXITY: Low ? ? ?GOALS: ?Goals reviewed with patient? Yes. No STG and LTG established as patient doesn't require further PT ? ?PLAN: ?PT FREQUENCY: one time visit ? ?PT DURATION:  1 sessions ? ?PLANNED INTERVENTIONS: Gait training and Patient/Family education ? ? ? ? ?Kerrie Pleasure, PT ?08/27/2021, 11:15 AM ? ? ? ? ? ?  ?

## 2021-08-27 ENCOUNTER — Other Ambulatory Visit: Payer: Self-pay

## 2021-08-27 ENCOUNTER — Ambulatory Visit: Payer: Managed Care, Other (non HMO)

## 2021-08-27 ENCOUNTER — Ambulatory Visit: Payer: Managed Care, Other (non HMO) | Attending: Family Medicine | Admitting: Occupational Therapy

## 2021-08-27 DIAGNOSIS — R2689 Other abnormalities of gait and mobility: Secondary | ICD-10-CM | POA: Insufficient documentation

## 2021-08-27 DIAGNOSIS — M6281 Muscle weakness (generalized): Secondary | ICD-10-CM | POA: Diagnosis present

## 2021-08-27 DIAGNOSIS — R278 Other lack of coordination: Secondary | ICD-10-CM | POA: Insufficient documentation

## 2021-08-27 NOTE — Patient Instructions (Signed)
Walking Program: ?Walk during cooler times of the day. ?Starting next week, ?Walk 6 min per day for 7 days. Following week add 2 minutes to your total time. ?Week 1 6 min ?Week 2: 8 min ?Week 3: 10 min ?Week 4: 12 min...Marland KitchenMarland KitchenUntil you can walk to 30 min per day ? ?

## 2021-08-27 NOTE — Patient Instructions (Signed)
?  Strengthening: Resisted Flexion ? ? ? ?Strengthening: Resisted Extension ? ? ?Hold tubing in ___right__ hand(s), arm forward. Pull arm back, elbow straight. ?Repeat _10___ times per set. Do _1-2___ sessions per day, every other day. ? ? ?Resisted Horizontal Abduction: Bilateral ? ? ?Sit or stand, tubing in both hands, arms out in front. Keeping arms straight, pinch shoulder blades together and stretch arms out.Keep arms low ?Repeat _10___ times per set. Do _1-2___ sessions per day, every other day. ? ? ?Elbow Flexion: Resisted ? ? ?With tubing held in __right____ hand(s) and other end secured under foot, curl arm up as far as possible. ?Repeat _10___ times per set. Do _1-2___ sessions per day, every other day. ? ? ? ?Elbow Extension: Resisted ? ? ?Sit in chair and  hold with other hand and ___right____ elbow bent. Straighten elbow. ?Repeat _10___ times per set.  Do _1-2___ sessions per day, every other day. ? ? ?Copyright ? [employer]. All rights reserved.   ? ? ?Coordination Activities ? ?Perform the following activities for 10 minutes 1 times per day with right hand(s). ? ?Flip cards 1 at a time as fast as you can. ?Deal cards with your thumb (Hold deck in hand and push card off top with thumb). ?Pick up coins and place in container or coin bank. ?Pick up coins and stack. ?Practice writing and/or typing. ?

## 2021-08-27 NOTE — Therapy (Signed)
[facility] ?[place] ?[place], [place], [address] ?Phone: [phone]   Fax:  [phone] ? ?Occupational Therapy Evaluation ? ?Patient Details  ?Name: Alexandra Wilson ?MRN: [id number] ?Date of Birth: September 05, 1960 ?Referring Provider (OT): Dr. Shaune Leeks (pt was referred by hospitalist, eval to be sent to PCP) ? ? ?Encounter Date: 08/27/2021 ? ? OT End of Session - 08/27/21 1305   ? ? Visit Number 1   ? Number of Visits 1   ? Authorization Type cigna   ? OT Start Time 1104   ? OT Stop Time 1134   ? OT Time Calculation (min) 30 min   ? ?  ?  ? ?  ? ? ?Past Medical History:  ?Diagnosis Date  ? Diabetes mellitus type II 07/19/2011  ? Hyperlipidemia   ? Hypertension   ? ? ?Past Surgical History:  ?Procedure Laterality Date  ? CESAREAN SECTION    ? ? ?There were no vitals filed for this visit. ? ? Subjective Assessment - 08/27/21 1304   ? ? Subjective  Pt denies pain   ? Patient Stated Goals Pt reports she had mild weakness in RUE initally   ? Currently in Pain? No/denies   ? ?  ?  ? ?  ? ? ? ? [facility] OT Assessment - 08/27/21 1108   ? ?  ? Assessment  ? Medical Diagnosis CVA   ? Referring Provider (OT) Dr. Shaune Leeks   pt was referred by hospitalist, eval to be sent to PCP  ? Onset Date/Surgical Date 08/10/21   ? Hand Dominance Right   ?  ? Precautions  ? Precautions None   ?  ? Balance Screen  ? Has the patient fallen in the past 6 months No   ? Has the patient had a decrease in activity level because of a fear of falling?  No   ? Is the patient reluctant to leave their home because of a fear of falling?  No   ?  ? Home  Environment  ? Family/patient expects to be discharged to: Private residence   ? Lives With Son   ?  ? Prior Function  ? Vocation Full time employment   ? Vocation Requirements computer jobs   ?  ? ADL  ? ADL comments modified I with all basic ADLs.   ?  ? IADL  ? Shopping Takes care of all shopping needs independently   ? Light Housekeeping  Maintains house alone or with occasional assistance   ? Medication Management Is responsible for taking medication in correct dosages at correct time   ? Financial Management Manages day-to-day purchases, but needs help with banking, major purchases, etc.   ?  ? Mobility  ? Mobility Status Independent   ?  ? Written Expression  ? Handwriting 100% legible   ?  ? Vision - History  ? Baseline Vision No visual deficits   ?  ? Vision Assessment  ? Vision Assessment Vision not tested   denies changes  ?  ? Cognition  ? Overall Cognitive Status Within Functional Limits for tasks assessed   ?  ? Sensation  ? Light Touch Impaired by gross assessment   numb in fingertip  ?  ? Coordination  ? Gross Motor Movements are Fluid and Coordinated Yes   ? Fine Motor Movements are Fluid and Coordinated No   ? 9 Hole Peg Test Right;Left   ? Right 9 Hole Peg Test 30.62   ?  Left 9 Hole Peg Test 26.90   ?  ? ROM / Strength  ? AROM / PROM / Strength AROM;Strength   ?  ? AROM  ? Overall AROM  Within functional limits for tasks performed   ?  ? Strength  ? Overall Strength Deficits   ? Overall Strength Comments RUE grossly 4/5, LUE 4+/5   ?  ? Hand Function  ? Right Hand Grip (lbs) 47.3   ? Left Hand Grip (lbs) 45.8   ? ?  ?  ? ?  ? ? ? ? ? ? ? ? ? ? ? ? ? ? ? ? ? ? ? OT Education - 08/27/21 1304   ? ? Education Details red theraband HEP, coordination HEP , education regarding sensory precautions  ? Person(s) Educated Patient   ? Methods Explanation;Demonstration;Verbal cues;Handout   ? Comprehension Verbalized understanding;Returned demonstration;Verbal cues required   ? ?  ?  ? ?  ? ? ? ? ? ? OT Long Term Goals - 08/27/21 1309   ? ?  ? OT LONG TERM GOAL #1  ? Title n/a   ? ?  ?  ? ?  ? ? ? ? ? ? ? ? Plan - 08/27/21 1306   ? ? Clinical Impression Statement Alexandra Wilson is a 61 y.o. female who presented with R leg numbness + heaviness to ED on 08/10/21 Pt was dianosed with L pontine stroke. PMH significant for hx of DM2, HTN, HLD, current  smoker. Pt was d/c home 08/11/21  Pt presents with the following deficits:   mildly decreased RUE coordination and strength and sensory deficits. Pt was seen for OT eval and treatment on day of evaluation. No additional OT recommended.   ? OT Occupational Profile and History Problem Focused Assessment - Including review of records relating to presenting problem   ? Occupational performance deficits (Please refer to evaluation for details): ADL's;IADL's;Work;Leisure;Social Participation   ? Body Structure / Function / Physical Skills ADL;Balance;Endurance;Mobility;Strength;Flexibility;UE functional use;FMC;Coordination;Gait;GMC;Decreased knowledge of use of DME;Dexterity;IADL;Sensation;Decreased knowledge of precautions   ? Rehab Potential Good   ? Clinical Decision Making Limited treatment options, no task modification necessary   ? Comorbidities Affecting Occupational Performance: May have comorbidities impacting occupational performance   ? Modification or Assistance to Complete Evaluation  No modification of tasks or assist necessary to complete eval   ? OT Frequency One time visit   ? OT Duration 4 weeks   ? OT Treatment/Interventions Self-care/ADL training;Patient/family education;Therapeutic exercise   ? Plan Pt was issued a HEP for UE strength and coordination, no additional visits scheduled. Pt plans to work on strength and coordination at home.  ? Consulted and Agree with Plan of Care Patient   ? ?  ?  ? ?  ? ? ?Patient will benefit from skilled therapeutic intervention in order to improve the following deficits and impairments:   ?Body Structure / Function / Physical Skills: ADL, Balance, Endurance, Mobility, Strength, Flexibility, UE functional use, FMC, Coordination, Gait, GMC, Decreased knowledge of use of DME, Dexterity, IADL, Sensation, Decreased knowledge of precautions ?  ?  ? ? ?Visit Diagnosis: ?Other lack of coordination ? ?Muscle weakness (generalized) ? ? ? ?Problem List ?Patient Active Problem  List  ? Diagnosis Date Noted  ? CVA (cerebral vascular accident) ([facility]) 08/10/2021  ? Rash 08/10/2021  ? History of abnormal Pap smear 08/21/2011  ? Plantar wart 08/17/2011  ? Type 2 diabetes mellitus ([facility]) 07/19/2011  ? Tobacco abuse 07/19/2011  ? Hyperlipidemia  07/09/2011  ? Essential (primary) hypertension 07/08/2011  ? ? ?Alexandra Wilson, OT ?08/27/2021, 1:13 PM ?Theone Murdoch, OTR/L ?[phone] ?Phone: [phone] ?1:13 PM 08/27/21  ?[facility] ?[place] ?[place], [place], [address] ?Phone: [phone]   Fax:  [phone] ? ?Name: Alexandra Wilson ?MRN: [id number] ?Date of Birth: Nov 20, 1960 ? ?

## 2021-09-01 NOTE — Telephone Encounter (Signed)
No additional needed ?

## 2021-09-03 ENCOUNTER — Telehealth: Payer: Self-pay

## 2021-09-08 ENCOUNTER — Ambulatory Visit (INDEPENDENT_AMBULATORY_CARE_PROVIDER_SITE_OTHER): Payer: Managed Care, Other (non HMO) | Admitting: Family Medicine

## 2021-09-08 ENCOUNTER — Encounter: Payer: Self-pay | Admitting: Family Medicine

## 2021-09-08 ENCOUNTER — Other Ambulatory Visit: Payer: Self-pay

## 2021-09-08 VITALS — BP 163/91 | HR 96 | Ht 66.0 in | Wt 159.0 lb

## 2021-09-08 DIAGNOSIS — E7800 Pure hypercholesterolemia, unspecified: Secondary | ICD-10-CM | POA: Diagnosis not present

## 2021-09-08 DIAGNOSIS — Z794 Long term (current) use of insulin: Secondary | ICD-10-CM

## 2021-09-08 DIAGNOSIS — E119 Type 2 diabetes mellitus without complications: Secondary | ICD-10-CM | POA: Diagnosis not present

## 2021-09-08 DIAGNOSIS — I6329 Cerebral infarction due to unspecified occlusion or stenosis of other precerebral arteries: Secondary | ICD-10-CM | POA: Diagnosis not present

## 2021-09-08 DIAGNOSIS — I10 Essential (primary) hypertension: Secondary | ICD-10-CM | POA: Diagnosis not present

## 2021-09-08 DIAGNOSIS — Z720 Tobacco use: Secondary | ICD-10-CM

## 2021-09-08 NOTE — Progress Notes (Signed)
?Alexandra Wilson ?[address] ?[place]. [place] [address] ?[phone] ? ?     HOSPITAL FOLLOW UP NOTE ? ?Alexandra Wilson ?Date of Birth:  07/27/1960 ?Medical Record Number:  [id number]  ? ?Reason for Referral:  hospital stroke follow up ? ? ? ?SUBJECTIVE: ? ? ?CHIEF COMPLAINT:  ?Chief Complaint  ?Patient presents with  ? Follow-up  ?  Rm 2, alone. Here for recent hospital visit for stroke. Pt reports doing well today. R leg still feels heavy when waking. Doing exercises at home to help.   ? ? ?HPI:  ? ?Alexandra Wilson is a 61 y.o. who  has a past medical history of CVA (cerebral vascular accident) ([facility]), Diabetes mellitus type II (07/19/2011), Hyperlipidemia, and Hypertension.  Patient presented on 08/10/2021 with right sided leg numbness and weakness. Per Dr Phoebe Sharps note, "MRI showed left pontine acute and right pontine subacute infarct.  MRI right ICA chronic occlusion, right terminal ICA reconstituted from right P-comm, right MCA and ACA patent.  Carotid Doppler confirmed total occluded right ICA.  EF 55 to 60%, LDL 121, A1c 6.4. Creatinine 0.93." hospitalization was uncomplicated and she was discharged home 08/11/2021 on DAPT for 3 weeks then asa 13m alone. Outpatient PT recommended. History of noncompliance with medication regimen and follow up due to being uninsured. Personally reviewed hospitalization pertinent progress notes, lab work and imaging.  Evaluated by Dr XErlinda Hong  ? ?Since discharge, she feels that she is doing well. She does note some heaviness of the right leg when walking. She was evaluated by neuro rehab PT/OT 08/27/2021. She was given HEP for strength and coordination. She has been walking daily and trying to increase time. She denies falls. No assistive devices. She has not monitored BP at home. She has ordered a BP machine to use at home. Reading was 154/89 at PCP visit 08/15/2021. She reports taking lisinopril 442mdaily. She has continued atorvastatin and asa 8161mDiscontinued  Plavix. She has not taken Lantus in years but does continue metformin 500m27mD. She does not routinely check CBGs.  ? ?She is trying to cut back on smoking. She is using nicotine patches. She was smoking 1.5ppd, now smoking about 10 cigarettes.  ? ?She lives with her son.  ? ? ?PERTINENT IMAGING/LABS ? ?Code Stroke CT head No acute abnormality. Old small vessel infarcts in pons and mild chronic small-vessel change of the cerebral hemispheric white matter. ASPECTS 10.    ?MRI  1.5 cm acute ischemic nonhemorrhagic left paramedian pontine infarct. Additional punctate acute to early subacute ischemic nonhemorrhagic right pontine infarct. Underlying mild to moderate chronic microvascular ischemic disease. ?MRA  Occluded Right ICA, likely chronic given absence of acute signal changes in the right anterior circulation yesterday. Right ICA terminus is reconstituted in part by the right Pcomm. Right ACA and MCA branches appear within normal limits. ?Carotid Doppler  Total occlusion of R ICA ?2D Echo EF 55 to 60% ? ? ?A1C ?Lab Results  ?Component Value Date  ? HGBA1C 6.4 (H) 08/11/2021  ? ? ?Lipid Panel  ?   ?Component Value Date/Time  ? CHOL 187 08/11/2021 0314  ? CHOL 210 (H) 02/01/2017 1135  ? TRIG 61 08/11/2021 0314  ? HDL 54 08/11/2021 0314  ? HDL 56 02/01/2017 1135  ? CHOLHDL 3.5 08/11/2021 0314  ? VLDL 12 08/11/2021 0314  ? LDLCM[place] (H) 08/11/2021 0314  ? LDLCALC 140 (H) 02/01/2017 1135  ? LDLDIRECT 114 (H) 12/01/2011 1658  ? LABVLDL 14 02/01/2017 1135  ? ? ? ? ?  ROS:   ?14 system review of systems performed and negative with exception of those listed in HPI ? ?PMH:  ?Past Medical History:  ?Diagnosis Date  ? CVA (cerebral vascular accident) [facility])   ? Diabetes mellitus type II 07/19/2011  ? Hyperlipidemia   ? Hypertension   ? ? ?PSH:  ?Past Surgical History:  ?Procedure Laterality Date  ? CESAREAN SECTION    ? ? ?Social History:  ?Social History  ? ?Socioeconomic History  ? Marital status: Single  ?  Spouse name:  Not on file  ? Number of children: 1  ? Years of education: Not on file  ? Highest education level: Bachelor's degree (e.g., BA, AB, BS)  ?Occupational History  ? Not on file  ?Tobacco Use  ? Smoking status: Every Day  ?  Years: 20.00  ?  Types: Cigarettes  ? Smokeless tobacco: Never  ?Vaping Use  ? Vaping Use: Never used  ?Substance and Sexual Activity  ? Alcohol use: No  ? Drug use: No  ? Sexual activity: Never  ?  Birth control/protection: Abstinence  ?Other Topics Concern  ? Not on file  ?Social History Narrative  ? Lives with son  ? Right handed  ? Caffeine: 3 cups a day  ? ?Social Determinants of Health  ? ?Financial Resource Strain: Not on file  ?Food Insecurity: Not on file  ?Transportation Needs: Not on file  ?Physical Activity: Not on file  ?Stress: Not on file  ?Social Connections: Not on file  ?Intimate Partner Violence: Not on file  ? ? ?Family History:  ?Family History  ?Problem Relation Age of Onset  ? Hypertension Mother   ? Cancer Sister   ? Diabetes Cousin   ?     also maternal aunts  ? Colon cancer Neg Hx   ? ? ?Medications:   ?Current Outpatient Medications on File Prior to Visit  ?Medication Sig Dispense Refill  ? aspirin 81 MG tablet Take 1 tablet (81 mg total) by mouth daily. 30 tablet 1  ? ASPIRIN LOW DOSE 81 MG EC tablet Take 81 mg by mouth daily.    ? atorvastatin (LIPITOR) 40 MG tablet Take 1 tablet (40 mg total) by mouth daily. 30 tablet 2  ? Blood Glucose Monitoring Suppl (ONE TOUCH ULTRA SYSTEM KIT) W/DEVICE KIT 1 kit by Does not apply route once. May dispense generic as covered by insurance 1 each 0  ? Insulin Pen Needle 31G X 8 MM MISC 1 each by Does not apply route as directed. 100 each 3  ? lisinopril (ZESTRIL) 40 MG tablet Take 1 tablet (40 mg total) by mouth daily. 90 tablet 3  ? metFORMIN (GLUCOPHAGE) 500 MG tablet Take 1 tablet (500 mg total) by mouth 2 (two) times daily. 60 tablet 2  ? nicotine (NICODERM CQ - DOSED IN MG/24 HOURS) 21 mg/24hr patch Place 1 patch (21 mg total)  onto the skin daily. 28 patch 0  ? ?No current facility-administered medications on file prior to visit.  ? ? ?Allergies:  No Known Allergies ? ? ? ?OBJECTIVE: ? ?Physical Exam ? ?Vitals:  ? 09/08/21 1356  ?BP: (!) 163/91  ?Pulse: 96  ?Weight: 159 lb (72.1 kg)  ?Height: $RemoveB'5\' 6"'WQcFFiQC$  (1.676 m)  ? ?Body mass index is 25.66 kg/m?Marland Kitchen ?No results found. ? ?Depression screen [facility] 2/9 08/15/2021  ?Decreased Interest 0  ?Down, Depressed, Hopeless 0  ?PHQ - 2 Score 0  ?  ? ?General: well developed, well nourished, seated, in no evident  distress ?Head: head normocephalic and atraumatic.   ?Neck: supple with no carotid or supraclavicular bruits ?Cardiovascular: regular rate and rhythm, no murmurs ?Musculoskeletal: no deformity ?Skin:  no rash/petichiae ?Vascular:  Normal pulses all extremities ?  ?Neurologic Exam ?Mental Status: Awake and fully alert.  Fluent speech and language.  Oriented to place and time. Recent and remote memory intact. Attention span, concentration and fund of knowledge appropriate. Mood and affect appropriate.  ?Cranial Nerves: Fundoscopic exam reveals sharp disc margins. Pupils equal, briskly reactive to light. Extraocular movements full without nystagmus. Visual fields full to confrontation. Hearing intact. Facial sensation intact. Face, tongue, palate moves normally and symmetrically.  ?Motor: Normal bulk and tone. Normal strength in all tested extremity muscles with exception of right hip flexion of 4+/5.  ?Sensory.: intact to touch ?Coordination: Rapid alternating movements normal in all extremities. Finger-to-nose and heel-to-shin performed accurately bilaterally. ?Gait and Station: Arises from chair without difficulty. Stance is normal. Gait demonstrates normal stride length and balance without assistive device  ?Reflexes: 1+ and symmetric.  ? ? ?NIHSS  1 ?Modified Rankin  1 ? ? ? ?ASSESSMENT: Merit Gadsby is a 61 y.o. year old female presented on 08/10/2021 with right sided leg numbness and weakness.  Vascular risk factors include HLD, HTN, DMT2, and tobacco use.  ? ? ?PLAN: ? ?Stroke: bilateral pontine infarcts likely secondary to small vessel disease : Residual deficit: right lower extremity weakness. Cont

## 2021-09-08 NOTE — Patient Instructions (Addendum)
Below is our plan: ? ?We will continue aspirin 81 mg daily and atorvastatin for secondary stroke prevention.  Discussed secondary stroke prevention measures and importance of close PCP follow up for aggressive stroke risk factor management. I have gone over the pathophysiology of stroke, warning signs and symptoms, risk factors and their management in some detail with instructions to go to the closest emergency room for symptoms of concern. ?HTN: BP goal <130/90. Elevated in the office today. Reading is 163/91. Continue lisinopril '40mg'$  daily per PCP. Please let PCP know if readings stay elevated.  ?HLD: LDL goal <70. Recent LDL 121. Continue atorvastatin '40mg'$  daily per PCP.  ?DMII: A1c goal<7.0. Recent A1c 6.4. Continue metformin '500mg'$  twice daily. Continue close monitoring per PCP.  ?Tobacco abuse: continue smoking cessation efforts  ? ? ?Please make sure you are staying well hydrated. I recommend 50-60 ounces daily. Well balanced diet and regular exercise encouraged. Consistent sleep schedule with 6-8 hours recommended.  ? ?Please continue follow up with care team as directed.  ? ?Follow up with me in 6 months  ? ?You may receive a survey regarding today's visit. I encourage you to leave honest feed back as I do use this information to improve patient care. Thank you for seeing me today!  ? ? ?

## 2021-09-11 NOTE — Progress Notes (Signed)
I agree with the above plan 

## 2021-09-15 ENCOUNTER — Ambulatory Visit: Payer: 59 | Admitting: Nurse Practitioner

## 2021-09-30 NOTE — Telephone Encounter (Signed)
No additional note needed 

## 2022-01-03 ENCOUNTER — Other Ambulatory Visit: Payer: Self-pay | Admitting: Nurse Practitioner

## 2022-01-03 DIAGNOSIS — E7800 Pure hypercholesterolemia, unspecified: Secondary | ICD-10-CM

## 2022-01-03 DIAGNOSIS — I6302 Cerebral infarction due to thrombosis of basilar artery: Secondary | ICD-10-CM

## 2022-03-10 NOTE — Progress Notes (Deleted)
[facility] [address] [place]. [place] [address] [phone]       HOSPITAL FOLLOW UP NOTE  [name] Date of Birth:  06-08-1961 Medical Record Number:  [id number]   Reason for Referral:  hospital stroke follow up    SUBJECTIVE:   CHIEF COMPLAINT:  No chief complaint on file.   HPI:   Alexandra Wilson is a 61 y.o. who  has a past medical history of CVA (cerebral vascular accident) ([place]), Diabetes mellitus type II (07/19/2011), Hyperlipidemia, and Hypertension.  Patient presented on 08/10/2021 with right sided leg numbness and weakness. Per Dr Phoebe Sharps note, "MRI showed left pontine acute and right pontine subacute infarct.  MRI right ICA chronic occlusion, right terminal ICA reconstituted from right P-comm, right MCA and ACA patent.  Carotid Doppler confirmed total occluded right ICA.  EF 55 to 60%, LDL 121, A1c 6.4. Creatinine 0.93." hospitalization was uncomplicated and she was discharged home 08/11/2021 on DAPT for 3 weeks then asa 81mg  alone. Outpatient PT recommended. History of noncompliance with medication regimen and follow up due to being uninsured. Personally reviewed hospitalization pertinent progress notes, lab work and imaging.  Evaluated by Dr Erlinda Hong.   Since discharge, she feels that she is doing well. She does note some heaviness of the right leg when walking. She was evaluated by neuro rehab PT/OT 08/27/2021. She was given HEP for strength and coordination. She has been walking daily and trying to increase time. She denies falls. No assistive devices. She has not monitored BP at home. She has ordered a BP machine to use at home. Reading was 154/89 at PCP visit 08/15/2021. She reports taking lisinopril 40mg  daily. She has continued atorvastatin and asa 81mg . Discontinued Plavix. She has not taken Lantus in years but does continue metformin 500mg  BID. She does not routinely check CBGs.   She is trying to cut back on smoking. She is using nicotine patches.  She was smoking 1.5ppd, now smoking about 10 cigarettes.   She lives with her son.   UPDATE 03/11/2022 ALL: Alexandra Wilson returns for follow up post CVA in 07/2021.    PERTINENT IMAGING/LABS  Code Stroke CT head No acute abnormality. Old small vessel infarcts in pons and mild chronic small-vessel change of the cerebral hemispheric white matter. ASPECTS 10.    MRI  1.5 cm acute ischemic nonhemorrhagic left paramedian pontine infarct. Additional punctate acute to early subacute ischemic nonhemorrhagic right pontine infarct. Underlying mild to moderate chronic microvascular ischemic disease. MRA  Occluded Right ICA, likely chronic given absence of acute signal changes in the right anterior circulation yesterday. Right ICA terminus is reconstituted in part by the right Pcomm. Right ACA and MCA branches appear within normal limits. Carotid Doppler  Total occlusion of R ICA 2D Echo EF 55 to 60%   A1C Lab Results  Component Value Date   HGBA1C 6.4 (H) 08/11/2021    Lipid Panel     Component Value Date/Time   CHOL 187 08/11/2021 0314   CHOL 210 (H) 02/01/2017 1135   TRIG 61 08/11/2021 0314   HDL 54 08/11/2021 0314   HDL 56 02/01/2017 1135   CHOLHDL 3.5 08/11/2021 0314   VLDL 12 08/11/2021 0314   LDLCALC 121 (H) 08/11/2021 0314   LDLCALC 140 (H) 02/01/2017 1135   LDLDIRECT 114 (H) 12/01/2011 1658   LABVLDL 14 02/01/2017 1135      ROS:   14 system review of systems performed and negative with exception of those listed in HPI  PMH:  Past Medical History:  Diagnosis Date   CVA (cerebral vascular accident) (HCC)    Diabetes mellitus type II 07/19/2011   Hyperlipidemia    Hypertension     PSH:  Past Surgical History:  Procedure Laterality Date   CESAREAN SECTION      Social History:  Social History   Socioeconomic History   Marital status: Single    Spouse name: Not on file   Number of children: 1   Years of education: Not on file   Highest education level: Bachelor's  degree (e.g., BA, AB, BS)  Occupational History   Not on file  Tobacco Use   Smoking status: Every Day    Years: 20.00    Types: Cigarettes   Smokeless tobacco: Never  Vaping Use   Vaping Use: Never used  Substance and Sexual Activity   Alcohol use: No   Drug use: No   Sexual activity: Never    Birth control/protection: Abstinence  Other Topics Concern   Not on file  Social History Narrative   Lives with son   Right handed   Caffeine: 3 cups a day   Social Determinants of Health   Financial Resource Strain: Not on file  Food Insecurity: Not on file  Transportation Needs: Not on file  Physical Activity: Not on file  Stress: Not on file  Social Connections: Not on file  Intimate Partner Violence: Not on file    Family History:  Family History  Problem Relation Age of Onset   Hypertension Mother    Cancer Sister    Diabetes Cousin        also maternal aunts   Colon cancer Neg Hx     Medications:   Current Outpatient Medications on File Prior to Visit  Medication Sig Dispense Refill   aspirin 81 MG tablet Take 1 tablet (81 mg total) by mouth daily. 30 tablet 1   ASPIRIN LOW DOSE 81 MG EC tablet Take 81 mg by mouth daily.     atorvastatin (LIPITOR) 40 MG tablet TAKE 1 TABLET BY MOUTH EVERY DAY 90 tablet 0   Blood Glucose Monitoring Suppl (ONE TOUCH ULTRA SYSTEM KIT) W/DEVICE KIT 1 kit by Does not apply route once. May dispense generic as covered by insurance 1 each 0   lisinopril (ZESTRIL) 40 MG tablet Take 1 tablet (40 mg total) by mouth daily. 90 tablet 3   metFORMIN (GLUCOPHAGE) 500 MG tablet Take 1 tablet (500 mg total) by mouth 2 (two) times daily. 60 tablet 2   nicotine (NICODERM CQ - DOSED IN MG/24 HOURS) 21 mg/24hr patch Place 1 patch (21 mg total) onto the skin daily. 28 patch 0   No current facility-administered medications on file prior to visit.    Allergies:  No Known Allergies    OBJECTIVE:  Physical Exam  There were no vitals filed for this  visit.  There is no height or weight on file to calculate BMI. No results found.     08/15/2021   10:18 AM  Depression screen PHQ 2/9  Decreased Interest 0  Down, Depressed, Hopeless 0  PHQ - 2 Score 0     General: well developed, well nourished, seated, in no evident distress Head: head normocephalic and atraumatic.   Neck: supple with no carotid or supraclavicular bruits Cardiovascular: regular rate and rhythm, no murmurs Musculoskeletal: no deformity Skin:  no rash/petichiae Vascular:  Normal pulses all extremities   Neurologic Exam Mental Status: Awake and fully alert.  Fluent speech and language.  Oriented to place and time. Recent and remote memory intact. Attention span, concentration and fund of knowledge appropriate. Mood and affect appropriate.  Cranial Nerves: Fundoscopic exam reveals sharp disc margins. Pupils equal, briskly reactive to light. Extraocular movements full without nystagmus. Visual fields full to confrontation. Hearing intact. Facial sensation intact. Face, tongue, palate moves normally and symmetrically.  Motor: Normal bulk and tone. Normal strength in all tested extremity muscles with exception of right hip flexion of 4+/5.  Sensory.: intact to touch Coordination: Rapid alternating movements normal in all extremities. Finger-to-nose and heel-to-shin performed accurately bilaterally. Gait and Station: Arises from chair without difficulty. Stance is normal. Gait demonstrates normal stride length and balance without assistive device  Reflexes: 1+ and symmetric.    NIHSS  1 Modified Rankin  1    ASSESSMENT: Alexandra Wilson is a 61 y.o. year old female presented on 08/10/2021 with right sided leg numbness and weakness. Vascular risk factors include HLD, HTN, DMT2, and tobacco use.    PLAN:  Stroke: bilateral pontine infarcts likely secondary to small vessel disease : Residual deficit: right lower extremity weakness. Continue aspirin 81 mg daily  and  atorvastatin for secondary stroke prevention.  Discussed secondary stroke prevention measures and importance of close PCP follow up for aggressive stroke risk factor management. I have gone over the pathophysiology of stroke, warning signs and symptoms, risk factors and their management in some detail with instructions to go to the closest emergency room for symptoms of concern. HTN: BP goal <130/90. Elevated in the office, today 163/91. Continue lisinopril 106m daily per PCP. Advised to call PCP if readings are elevated at home.  HLD: LDL goal <70. Recent LDL 121. Continue atorvastatin 422mdaily per PCP.  DMII: A1c goal<7.0. Recent A1c 6.4. Continue metformin 50074mID. Continue close monitoring per PCP.  Tobacco abuse: consider smoking cessation, continue nicotine patches and close follow up with PCP.       Follow up in 6 months or call earlier if needed   CC:  [facility] provider: Dr. SetLeonie ManP: PasBo Merino NP    I spent 45 minutes of face-to-face and non-face-to-face time with patient.  This included previsit chart review including review of recent hospitalization, lab review, study review, order entry, electronic health record documentation, patient education regarding recent stroke including etiology, secondary stroke prevention measures and importance of managing stroke risk factors, residual deficits and typical recovery time and answered all other questions to patient satisfaction   AmyDebbora PrestoNPSurgicare Surgical Associates Of Wayne LLCuiStar [facility] [address] Green Lake AvenueiLimaeWilliamstownC [id number] [phone] [phone]: This document was prepared with digital dictation and possible smart phrase technology. Any transcriptional errors that result from this process are unintentional.

## 2022-03-11 ENCOUNTER — Ambulatory Visit: Payer: Managed Care, Other (non HMO) | Admitting: Family Medicine

## 2022-03-11 ENCOUNTER — Encounter: Payer: Self-pay | Admitting: Family Medicine

## 2022-03-11 DIAGNOSIS — I6329 Cerebral infarction due to unspecified occlusion or stenosis of other precerebral arteries: Secondary | ICD-10-CM

## 2022-04-12 ENCOUNTER — Other Ambulatory Visit: Payer: Self-pay | Admitting: Nurse Practitioner

## 2022-04-12 DIAGNOSIS — I6302 Cerebral infarction due to thrombosis of basilar artery: Secondary | ICD-10-CM

## 2022-04-12 DIAGNOSIS — E7800 Pure hypercholesterolemia, unspecified: Secondary | ICD-10-CM

## 2023-07-07 IMAGING — MR MR MRA HEAD W/O CM
1 series · 19 of 48 positions shown · non-contrast
Comparison: Brain MRI 08/10/2021.

CLINICAL DATA: 60-year-old female code stroke presentation. Left
greater than right pontine infarcts on MRI yesterday.

EXAM:
MRA HEAD WITHOUT CONTRAST
TECHNIQUE: Angiographic images of the Circle of Willis were acquired using MRA
technique without intravenous contrast.

[Series 5: 3d cow · axial · 0.5mm · 0.41mm/px · z∈[-140,-61]mm · 19 of 172 slices shown]
[im 1/172]
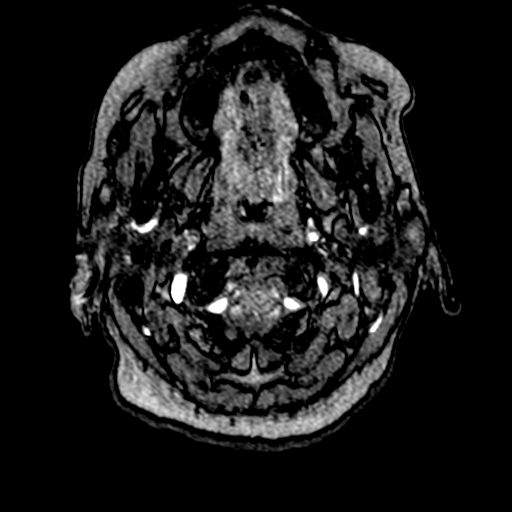
[im 4/172]
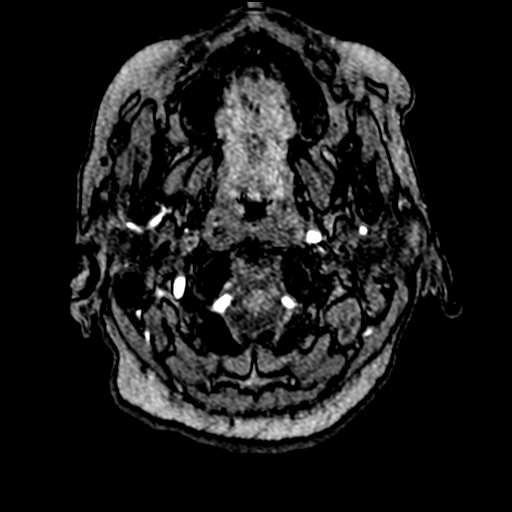
[im 8/172]
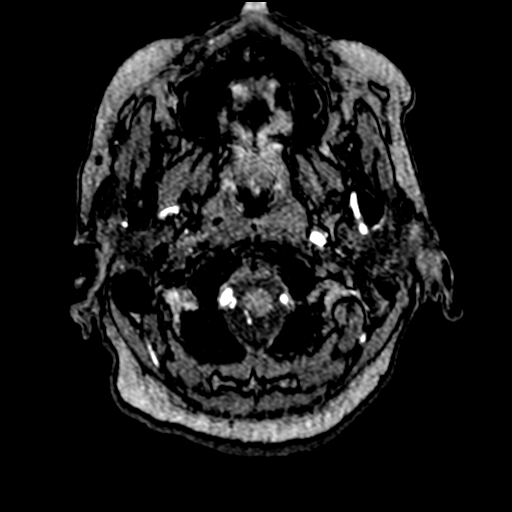
[im 11/172]
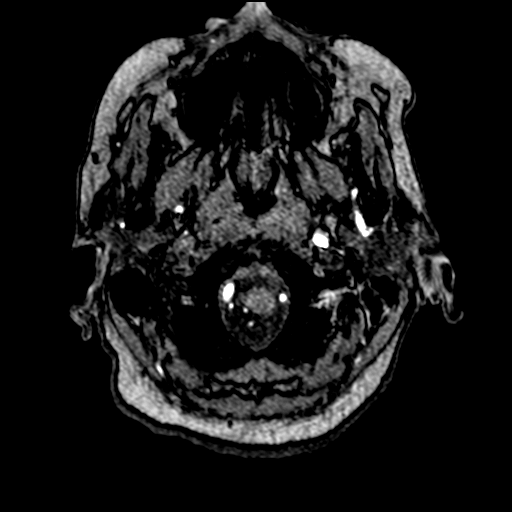
[im 15/172]
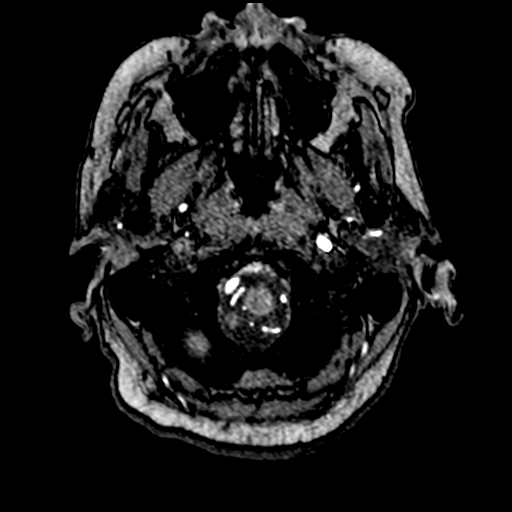
[im 19/172]
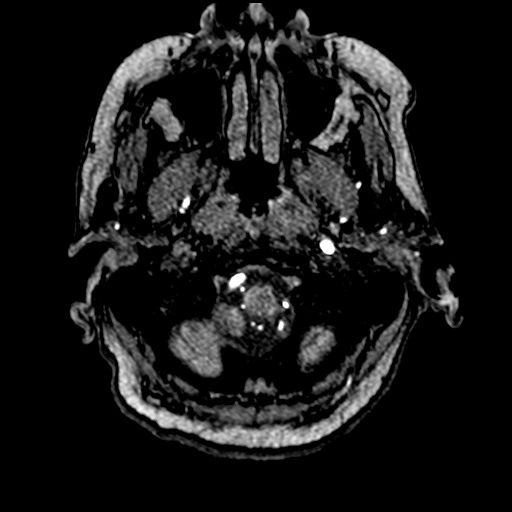
[im 22/172]
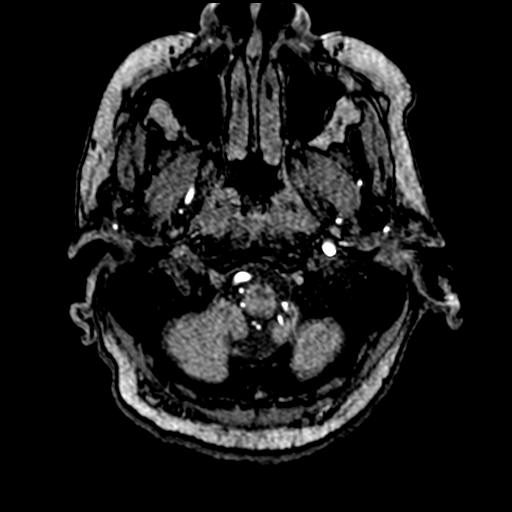
[im 26/172]
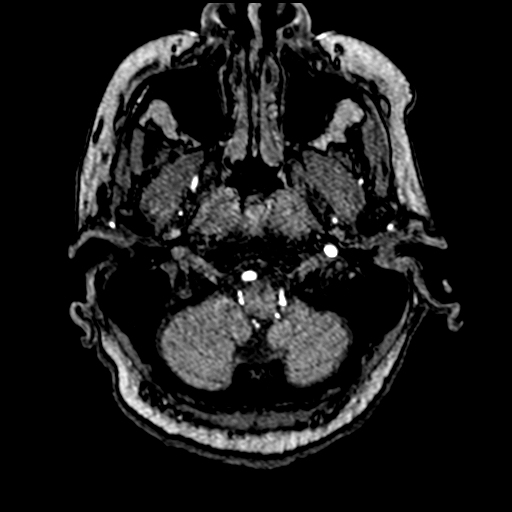
[im 30/172]
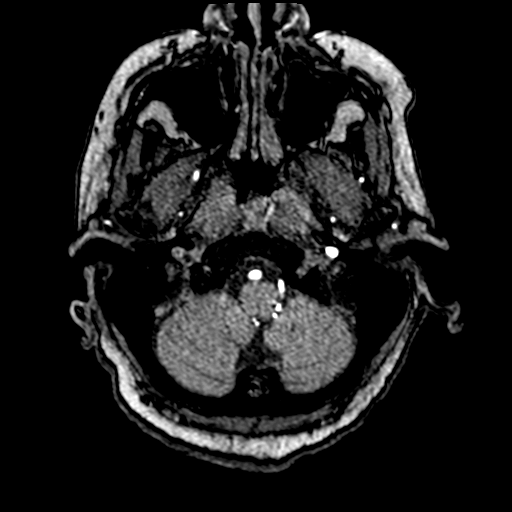
[im 33/172]
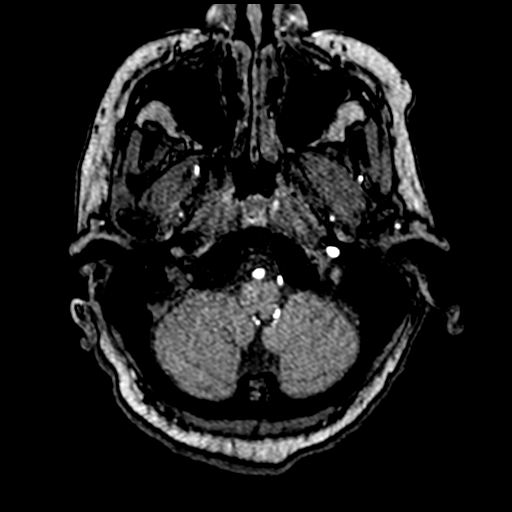
[im 37/172]
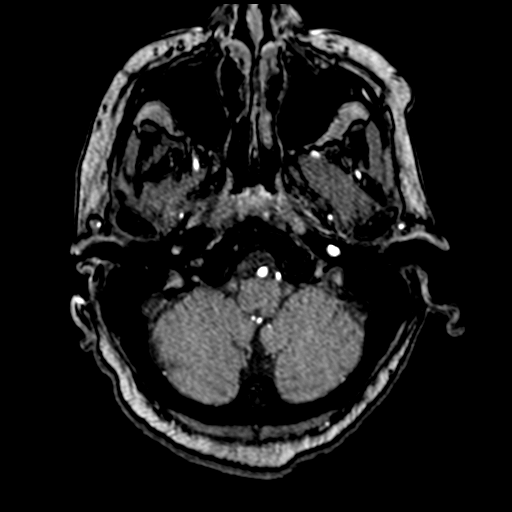
[im 55/172]
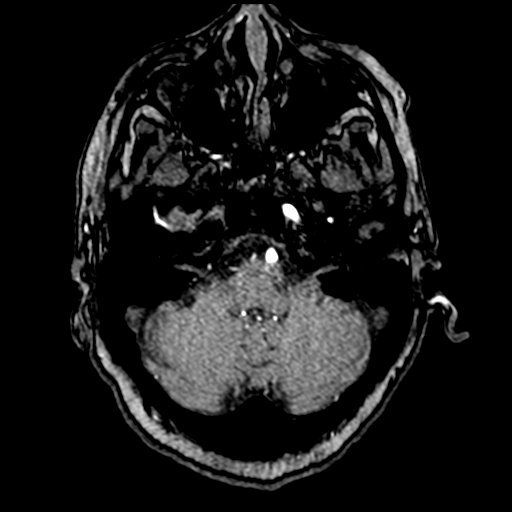
[im 77/172]
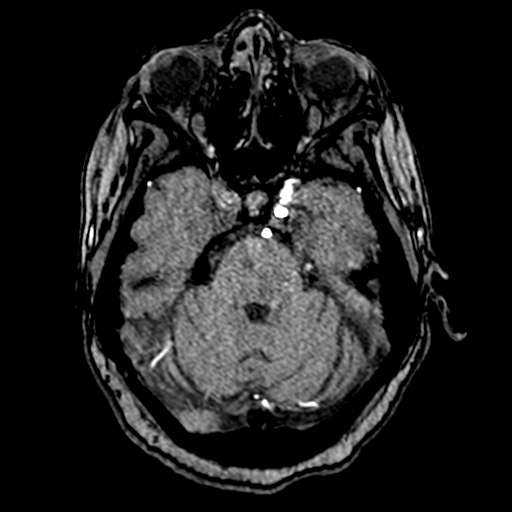
[im 88/172]
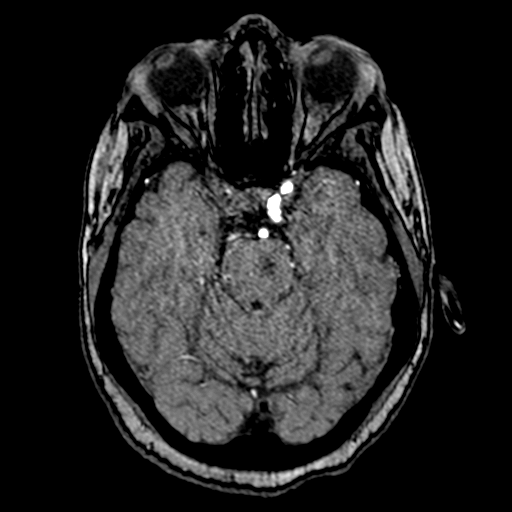
[im 99/172]
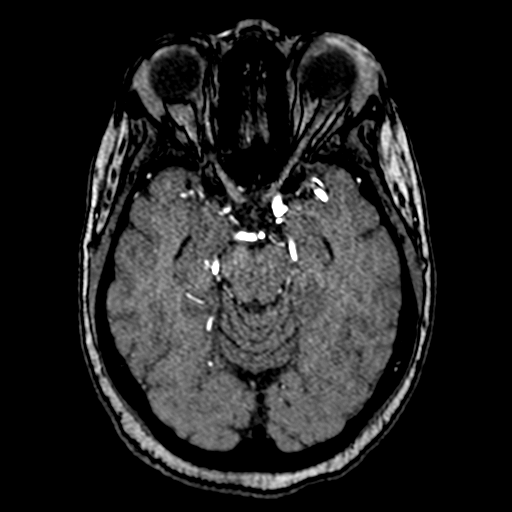
[im 121/172]
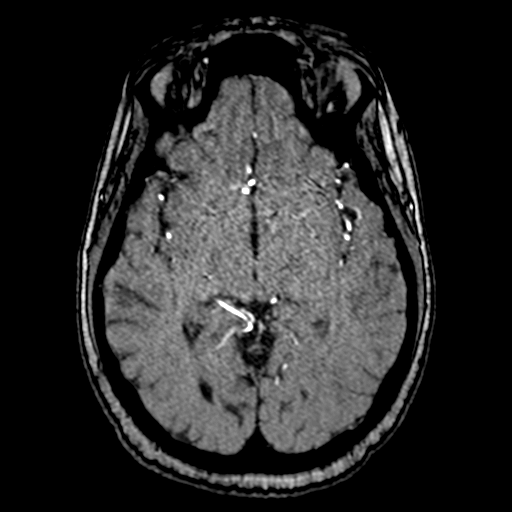
[im 142/172]
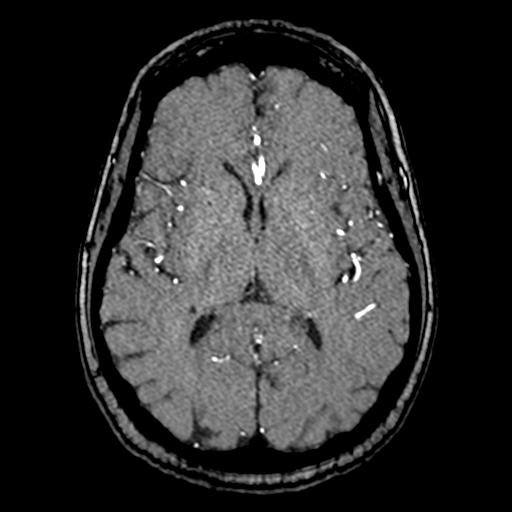
[im 146/172]
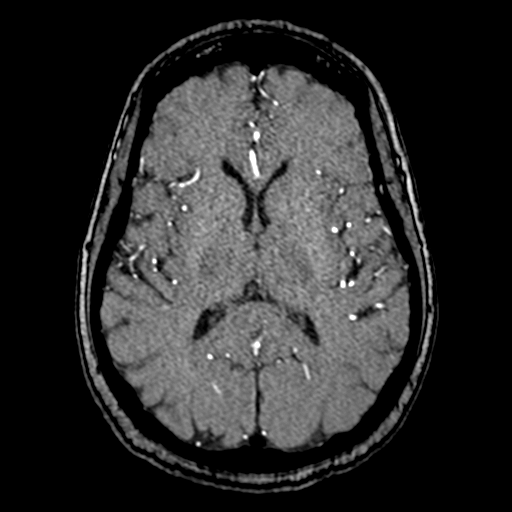
[im 164/172]
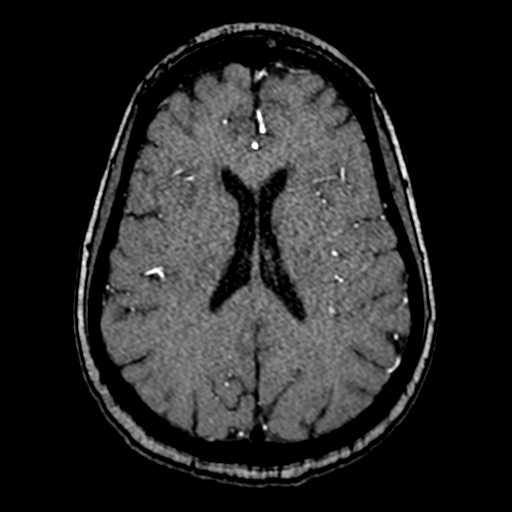

[19 of 48 positions shown; findings below may reference images not displayed]

FINDINGS: Anterior circulation: Absent flow in the right ICA until
reconstitution in the region of the right posterior communicating
artery. Contralateral left ICA siphon with antegrade flow signal but
moderate siphon irregularity in keeping with atherosclerosis.
Atherosclerotic pseudo lesion of the anterior genu suspected. No
significant left siphon stenosis. Patent left carotid terminus.

Bilateral MCA and ACA origins remain patent. Anterior communicating
artery is visible. Visible bilateral ACA branches are within normal
limits. Left MCA M1 segment is tortuous with patent trifurcation.
Left MCA branches are within normal limits.

Contralateral right MCA flow signal is relatively symmetric. Right
MCA bifurcation is patent without stenosis. Visible right MCA
branches are within normal limits.

Posterior circulation: Antegrade flow in the posterior circulation
with dominant right vertebral V4 segment. Normal PICA origins. No
distal vertebral stenosis. Patent vertebrobasilar junction and
basilar artery without stenosis. No significant basilar artery
irregularity. SCA and PCA origins are normal. Tortuous left P1
segment. Small posterior communicating arteries. Bilateral PCA
branches are within normal limits.

Anatomic variants: Dominant right vertebral artery.

Other: Rightward gaze deviation.
IMPRESSION: 1. Occluded Right ICA, likely chronic given absence of acute signal
changes in the right anterior circulation yesterday. Right ICA
terminus is reconstituted in part by the right Pcomm. Right ACA and
MCA branches appear within normal limits.

2. Negative posterior circulation.  Dominant right vertebral artery.

3. Atherosclerotic left ICA siphon, but no significant left siphon
stenosis.

## 2024-05-15 ENCOUNTER — Other Ambulatory Visit: Payer: Self-pay | Admitting: Medical

## 2024-05-15 ENCOUNTER — Other Ambulatory Visit: Payer: Self-pay

## 2024-05-15 DIAGNOSIS — Z1231 Encounter for screening mammogram for malignant neoplasm of breast: Secondary | ICD-10-CM

## 2024-05-16 LAB — HM DIABETES EYE EXAM

## 2024-05-17 ENCOUNTER — Ambulatory Visit
Admission: RE | Admit: 2024-05-17 | Discharge: 2024-05-17 | Disposition: A | Discharge status: Home / Self Care | Source: Ambulatory Visit

## 2024-05-17 DIAGNOSIS — Z1231 Encounter for screening mammogram for malignant neoplasm of breast: Secondary | ICD-10-CM

## 2024-06-08 ENCOUNTER — Ambulatory Visit: Admitting: Medical

## 2024-06-08 VITALS — BP 122/70 | HR 84 | Ht 65.5 in | Wt 150.8 lb

## 2024-06-08 DIAGNOSIS — Z720 Tobacco use: Secondary | ICD-10-CM

## 2024-06-08 DIAGNOSIS — R29898 Other symptoms and signs involving the musculoskeletal system: Secondary | ICD-10-CM

## 2024-06-08 DIAGNOSIS — I10 Essential (primary) hypertension: Secondary | ICD-10-CM | POA: Diagnosis not present

## 2024-06-08 DIAGNOSIS — Z794 Long term (current) use of insulin: Secondary | ICD-10-CM | POA: Diagnosis not present

## 2024-06-08 DIAGNOSIS — R270 Ataxia, unspecified: Secondary | ICD-10-CM

## 2024-06-08 DIAGNOSIS — E7800 Pure hypercholesterolemia, unspecified: Secondary | ICD-10-CM | POA: Diagnosis not present

## 2024-06-08 DIAGNOSIS — E119 Type 2 diabetes mellitus without complications: Secondary | ICD-10-CM

## 2024-06-08 DIAGNOSIS — Z8673 Personal history of transient ischemic attack (TIA), and cerebral infarction without residual deficits: Secondary | ICD-10-CM | POA: Diagnosis not present

## 2024-06-08 NOTE — Progress Notes (Signed)
 Name: Alexandra Wilson   Date of Visit: 06/08/2024   Date of last visit with me: Visit date not found   CHIEF COMPLAINT:  Chief Complaint  Patient presents with   New Patient (Initial Visit)    New pt TOC, had stroke 3 years ago and hasn't been able to drive since. Wants to drive again but her right foot isn't moving like she needs to to drive. She thinks PT is needed to help       HPI:  Discussed the use of AI scribe software for clinical note transcription with the patient, who gave verbal consent to proceed.  History of Present Illness   Alexandra Wilson is a 63 year old female who presents as a new patient today.    She has been lost to follow-up for couple years.  Her last routine care was 2023.  She has not driven since her stroke 3 years ago but wants to resume driving in the near future.  Her history is significant for stroke, hypertension, diabetes, hyperlipidemia, and she smokes about a pack and a half of cigarettes per day.  She has not been on any medicine for probably 2 or 3 years.  She lives with her son and relies on him for transportation.  She experiences right lower extremity weakness following a mini stroke diagnosed three years ago, which affects her ability to drive. She has not driven since the episode due to difficulty feeling the pedal and maneuvering her foot over the gas. A recent attempt to drive confirmed that the issue persists.  Her medical history includes a mini stroke, after which she was prescribed aspirin  and cholesterol medication. She was also on lisinopril  for high blood pressure and metformin  for type 2 diabetes. However, she has not been on any medications for the past three years. Her last known blood pressure was high, and she has not had recent blood work done in the past two years. Her last hemoglobin A1c was noted to be high, but no recent labs are available. She has not been following up regularly with any specialists and missed a recent  eye appointment, which has been rescheduled.  She would like to see physical therapy to work on her balance.  She feels wobbly when walking and requires sneakers for better balance. She experiences difficulty with balance, particularly on inclines, and cannot turn quickly without feeling unsteady. No numbness or tingling.  Her last ultrasound of the heart in February 2023 showed an ejection fraction of 55 to 60% with no regional wall motion abnormalities. Her last head imaging was also in 2023.   No other aggravating or relieving factors. No other complaint.  Past Medical History:  Diagnosis Date   CVA (cerebral vascular accident) ([facility])    Diabetes mellitus type II 07/19/2011   Hyperlipidemia    Hypertension     ROS as in subjective     Objective: BP 122/70   Pulse 84   Ht 5' 5.5 (1.664 m)   Wt 150 lb 12.8 oz (68.4 kg)   LMP  (LMP Unknown)   SpO2 100%   BMI 24.71 kg/m   Wt Readings from Last 3 Encounters:  06/08/24 150 lb 12.8 oz (68.4 kg)  09/08/21 159 lb (72.1 kg)  08/15/21 159 lb (72.1 kg)   BP Readings from Last 3 Encounters:  06/08/24 122/70  09/08/21 (!) 163/91  08/15/21 (!) 154/89    General appearence: alert, no distress, WD/WN, African American female, strong tobacco odor HEENT:  normocephalic, sclerae anicteric, PERRLA, EOMi Neck: supple, no lymphadenopathy, no thyromegaly, no masses, no bruit but mild heart sounds radiating up into the carotids Heart: RRR, normal S1, S2, no murmurs Lungs: CTA bilaterally, no wheezes, rhonchi, or rales Musculoskeletal: nontender, no swelling, no obvious deformity Extremities: no edema, no cyanosis, no clubbing Pulses: 2+ symmetric, upper and lower extremities, normal cap refill Neurological: alert, oriented x 3, CN2-12 intact, overall strength seems pretty good upper and lower extremities, without any obvious asymmetry, but she seems to be a little off balance or little hesitant with walking, Romberg negative, DTRs normal   psychiatric: normal affect, behavior normal, pleasant     Assessment: Encounter Diagnoses  Name Primary?   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 Concerns about balance and driving safety due to reaction time and dizziness. - Referred to physical therapy for balance and strength improvement. - Advised against driving until after physical therapy and vision clearance. - Recommended practicing driving in a controlled environment with family support.  Type 2 diabetes mellitus Type 2 diabetes with previous high hemoglobin A1c. Goal to maintain A1c below 7%. - Ordered blood work to assess current diabetes control. - Discussed importance of regular diabetes management and monitoring.  Essential hypertension Hypertension with previous elevated readings. Goal to maintain blood pressure below 130/80 mmHg. - Ordered blood work to assess current blood pressure control. - Discussed importance of regular blood pressure monitoring.  Pure hypercholesterolemia Hypercholesterolemia with previous cholesterol buildup. Goal to manage cholesterol to reduce cardiovascular risk. - Ordered blood work to assess current cholesterol levels. - Discussed importance of cholesterol management.  Tobacco use disorder Continues to smoke 1.5 packs per day. Smoking increases risk for stroke and cardiovascular disease. - Strongly recommended smoking cessation. - Discussed potential use of nicotine  patches for smoking cessation.  General health maintenance Missed recent eye appointment. Importance of regular eye exams emphasized due to diabetes and  stroke history. - Ensure completion of rescheduled eye examination. - Discussed importance of regular health maintenance and screenings.  Alexandra Wilson was seen today for new patient (initial visit).  Diagnoses and all orders for this visit:  Type 2 diabetes mellitus without complication, with long-term current use of insulin  (HCC) -     CBC -     Comprehensive metabolic panel with GFR -     Hemoglobin A1c -     Microalbumin/Creatinine Ratio, Urine  History of stroke -     Ambulatory referral to Physical Therapy  Ataxia -     TSH -     Ambulatory referral to Physical Therapy  Essential (primary) hypertension -     Lipid panel -     TSH -     Microalbumin/Creatinine Ratio, Urine  Pure hypercholesterolemia -     Lipid panel  Tobacco abuse  Weakness of lower extremity, unspecified laterality -     Ambulatory referral to Physical Therapy    F/u pending labs

## 2024-06-09 ENCOUNTER — Ambulatory Visit: Payer: Self-pay | Admitting: Medical

## 2024-06-09 ENCOUNTER — Other Ambulatory Visit: Payer: Self-pay | Admitting: Medical

## 2024-06-09 DIAGNOSIS — I6302 Cerebral infarction due to thrombosis of basilar artery: Secondary | ICD-10-CM

## 2024-06-09 DIAGNOSIS — I10 Essential (primary) hypertension: Secondary | ICD-10-CM

## 2024-06-09 DIAGNOSIS — E7800 Pure hypercholesterolemia, unspecified: Secondary | ICD-10-CM

## 2024-06-09 LAB — COMPREHENSIVE METABOLIC PANEL WITH GFR
ALT: 6 IU/L (ref 0–32)
AST: 9 IU/L (ref 0–40)
Albumin: 4.2 g/dL (ref 3.9–4.9)
Alkaline Phosphatase: 66 IU/L (ref 49–135)
BUN/Creatinine Ratio: 9 — ABNORMAL LOW (ref 12–28)
BUN: 10 mg/dL (ref 8–27)
Bilirubin Total: 0.4 mg/dL (ref 0.0–1.2)
CO2: 23 mmol/L (ref 20–29)
Calcium: 9.7 mg/dL (ref 8.7–10.3)
Chloride: 105 mmol/L (ref 96–106)
Creatinine, Ser: 1.11 mg/dL — ABNORMAL HIGH (ref 0.57–1.00)
Globulin, Total: 2.7 g/dL (ref 1.5–4.5)
Glucose: 115 mg/dL — ABNORMAL HIGH (ref 70–99)
Potassium: 4.2 mmol/L (ref 3.5–5.2)
Sodium: 142 mmol/L (ref 134–144)
Total Protein: 6.9 g/dL (ref 6.0–8.5)
eGFR: 56 mL/min/1.73 — ABNORMAL LOW (ref >59)

## 2024-06-09 LAB — CBC
Hematocrit: 41.4 % (ref 34.0–46.6)
Hemoglobin: 13.2 g/dL (ref 11.1–15.9)
MCH: 28.8 pg (ref 26.6–33.0)
MCHC: 31.9 g/dL (ref 31.5–35.7)
MCV: 90 fL (ref 79–97)
Platelets: 217 x10E3/uL (ref 150–450)
RBC: 4.58 x10E6/uL (ref 3.77–5.28)
RDW: 15.4 % (ref 11.7–15.4)
WBC: 10.1 x10E3/uL (ref 3.4–10.8)

## 2024-06-09 LAB — LIPID PANEL
Chol/HDL Ratio: 3.2 ratio (ref 0.0–4.4)
Cholesterol, Total: 175 mg/dL (ref 100–199)
HDL: 55 mg/dL (ref >39)
LDL Chol Calc (NIH): 108 mg/dL — ABNORMAL HIGH (ref 0–99)
Triglycerides: 65 mg/dL (ref 0–149)
VLDL Cholesterol Cal: 12 mg/dL (ref 5–40)

## 2024-06-09 LAB — TSH: TSH: 1.21 u[IU]/mL (ref 0.450–4.500)

## 2024-06-09 LAB — MICROALBUMIN / CREATININE URINE RATIO
Creatinine, Urine: 54.8 mg/dL
Microalb/Creat Ratio: 57 mg/g{creat} — AB (ref 0–29)
Microalbumin, Urine: 31.2 ug/mL

## 2024-06-09 LAB — HEMOGLOBIN A1C
Est. average glucose Bld gHb Est-mCnc: 123 mg/dL
Hgb A1c MFr Bld: 5.9 % — ABNORMAL HIGH (ref 4.8–5.6)

## 2024-06-09 MED ORDER — ATORVASTATIN CALCIUM 40 MG PO TABS: 40.0000 mg | ORAL_TABLET | Freq: Every day | ORAL | 1 refills | Status: AC

## 2024-06-09 MED ORDER — LISINOPRIL 40 MG PO TABS: 40.0000 mg | ORAL_TABLET | Freq: Every day | ORAL | 1 refills | Status: AC

## 2024-06-09 NOTE — Telephone Encounter (Signed)
 Copied from CRM [id number]. Topic: Clinical - Medication Refill >> Jun 09, 2024 11:30 AM Myrick T wrote: Medication: aspirin  81 MG tablet, atorvastatin  (LIPITOR) 40 MG tablet and lisinopril  (ZESTRIL ) 40 MG tablet  Has the patient contacted their pharmacy? No  This is the patient's preferred pharmacy:  [id number] [address], [place] - [address] [place] [address] Phone: [phone] Fax: [phone]  Is this the correct pharmacy for this prescription? Yes  Has the prescription been filled recently? Yes  Is the patient out of the medication? Yes  Has the patient been seen for an appointment in the last year OR does the patient have an upcoming appointment? Yes  Can we respond through MyChart? Yes  Agent: Please be advised that Rx refills may take up to 3 business days. We ask that you follow-up with your pharmacy.

## 2024-06-09 NOTE — Progress Notes (Signed)
 Results through MyChart

## 2024-06-09 NOTE — Telephone Encounter (Signed)
 UNABLE TO PEND ORDER FOR REQUESTED 81 MG ASPIRIN 

## 2024-07-11 ENCOUNTER — Ambulatory Visit: Admitting: Medical

## 2024-07-12 ENCOUNTER — Encounter: Payer: Self-pay | Admitting: Medical

## 2024-12-07 ENCOUNTER — Encounter: Admitting: Medical
